# Patient Record
Sex: Female | Born: 1935 | Race: White | Hispanic: No | Marital: Single
Health system: Southern US, Community
[De-identification: ages and names within clinical notes are randomized; demographics above are authoritative.]

---

## 2008-05-06 ENCOUNTER — Emergency Department: Payer: Self-pay | Admitting: Internal Medicine

## 2008-09-19 ENCOUNTER — Ambulatory Visit: Payer: Self-pay | Admitting: Family Medicine

## 2009-05-25 ENCOUNTER — Inpatient Hospital Stay: Payer: Self-pay | Admitting: Specialist

## 2009-05-27 ENCOUNTER — Inpatient Hospital Stay: Payer: Self-pay | Admitting: Internal Medicine

## 2009-06-06 ENCOUNTER — Encounter: Payer: Self-pay | Admitting: Internal Medicine

## 2009-07-12 ENCOUNTER — Inpatient Hospital Stay: Payer: Self-pay | Admitting: Internal Medicine

## 2009-09-04 ENCOUNTER — Emergency Department: Payer: Self-pay | Admitting: Emergency Medicine

## 2009-09-09 ENCOUNTER — Emergency Department: Payer: Self-pay | Admitting: Emergency Medicine

## 2009-09-14 ENCOUNTER — Ambulatory Visit: Payer: Self-pay | Admitting: Family Medicine

## 2009-10-09 ENCOUNTER — Ambulatory Visit: Payer: Self-pay | Admitting: Family Medicine

## 2009-12-20 ENCOUNTER — Ambulatory Visit: Payer: Self-pay | Admitting: Specialist

## 2010-07-08 ENCOUNTER — Ambulatory Visit: Payer: Self-pay | Admitting: Specialist

## 2010-09-11 ENCOUNTER — Ambulatory Visit: Payer: Self-pay | Admitting: Urology

## 2011-01-14 ENCOUNTER — Ambulatory Visit: Payer: Self-pay | Admitting: Specialist

## 2011-07-17 ENCOUNTER — Ambulatory Visit: Payer: Self-pay | Admitting: Specialist

## 2011-07-17 LAB — CREATININE, SERUM: EGFR (Non-African Amer.): >60

## 2011-08-04 ENCOUNTER — Inpatient Hospital Stay: Payer: Self-pay | Admitting: Internal Medicine

## 2011-08-04 ENCOUNTER — Ambulatory Visit: Payer: Self-pay | Admitting: Internal Medicine

## 2011-08-04 LAB — PRO B NATRIURETIC PEPTIDE: B-Type Natriuretic Peptide: 312 pg/mL (ref 0–450)

## 2011-08-04 LAB — BASIC METABOLIC PANEL
BUN: 11 mg/dL (ref 7–18)
Chloride: 98 mmol/L (ref 98–107)
Creatinine: 0.59 mg/dL — ABNORMAL LOW (ref 0.60–1.30)
Osmolality: 280 (ref 275–301)
Potassium: 4.3 mmol/L (ref 3.5–5.1)
Sodium: 141 mmol/L (ref 136–145)

## 2011-08-04 LAB — CBC
HCT: 40 % (ref 35.0–47.0)
MCH: 29 pg (ref 26.0–34.0)
MCHC: 31.9 g/dL — ABNORMAL LOW (ref 32.0–36.0)
MCV: 91 fL (ref 80–100)
Platelet: 10 10*3/uL — CL (ref 150–440)
RBC: 4.4 10*6/uL (ref 3.80–5.20)
RDW: 13.5 % (ref 11.5–14.5)
WBC: 6.6 10*3/uL (ref 3.6–11.0)

## 2011-08-04 LAB — TROPONIN I: Troponin-I: <0.02 ng/mL

## 2011-08-05 LAB — BASIC METABOLIC PANEL
Anion Gap: 4 — ABNORMAL LOW (ref 7–16)
BUN: 15 mg/dL (ref 7–18)
Calcium, Total: 9 mg/dL (ref 8.5–10.1)
Chloride: 101 mmol/L (ref 98–107)
Co2: 38 mmol/L — ABNORMAL HIGH (ref 21–32)
EGFR (African American): >60
Osmolality: 286 (ref 275–301)

## 2011-08-05 LAB — CBC WITH DIFFERENTIAL/PLATELET
Bands: 3 %
Basophil %: 1.2 %
Eosinophil %: 1 %
HCT: 35.7 % (ref 35.0–47.0)
Lymphocyte %: 7.1 %
Lymphocytes: 8 %
MCHC: 32 g/dL (ref 32.0–36.0)
MCV: 90 fL (ref 80–100)
Monocyte #: 0.5 x10 3/mm (ref 0.2–0.9)
Monocyte %: 9.2 %
Monocytes: 7 %
Neutrophil %: 81.5 %
RDW: 13.3 % (ref 11.5–14.5)
Segmented Neutrophils: 81 %
Variant Lymphocyte - H1-Rlymph: 0 %

## 2011-08-05 LAB — FIBRIN DEGRADATION PROD.(ARMC ONLY): Fibrin Degradation Prod.: >10 — ABNORMAL HIGH (ref 2.1–7.7)

## 2011-08-05 LAB — IRON AND TIBC
Iron Bind.Cap.(Total): 341 ug/dL (ref 250–450)
Iron Saturation: 13 %

## 2011-08-05 LAB — PROTIME-INR: INR: 0.9

## 2011-08-05 LAB — RETICULOCYTES: Reticulocyte: 1.92 % — ABNORMAL HIGH (ref 0.5–1.5)

## 2011-08-05 LAB — FOLATE: Folic Acid: 18.9 ng/mL (ref 3.1–100.0)

## 2011-08-06 LAB — PROT IMMUNOELECTROPHORES(ARMC)

## 2011-08-06 LAB — CBC WITH DIFFERENTIAL/PLATELET
Basophil #: 0 10*3/uL (ref 0.0–0.1)
Eosinophil %: 0 %
HCT: 37.9 % (ref 35.0–47.0)
HGB: 12.1 g/dL (ref 12.0–16.0)
Lymphocyte #: 0.2 10*3/uL — ABNORMAL LOW (ref 1.0–3.6)
Lymphocyte %: 4.4 %
MCV: 90 fL (ref 80–100)
Monocyte %: 1.3 %
Neutrophil #: 3.3 10*3/uL (ref 1.4–6.5)
Platelet: 33 10*3/uL — ABNORMAL LOW (ref 150–440)
RBC: 4.21 10*6/uL (ref 3.80–5.20)
RDW: 13.1 % (ref 11.5–14.5)
WBC: 3.6 10*3/uL (ref 3.6–11.0)

## 2011-08-06 LAB — CA 125: CA 125: 18.1 U/mL (ref 0.0–34.0)

## 2011-08-08 ENCOUNTER — Ambulatory Visit: Payer: Self-pay | Admitting: Internal Medicine

## 2011-08-08 LAB — CBC CANCER CENTER
Basophil #: 0 x10 3/mm (ref 0.0–0.1)
HCT: 42.3 % (ref 35.0–47.0)
HGB: 13.1 g/dL (ref 12.0–16.0)
Lymphocyte #: 0.1 x10 3/mm — ABNORMAL LOW (ref 1.0–3.6)
Lymphocyte %: 1.1 %
MCH: 28.5 pg (ref 26.0–34.0)
MCHC: 31 g/dL — ABNORMAL LOW (ref 32.0–36.0)
MCV: 92 fL (ref 80–100)
Monocyte %: 0.7 %
RDW: 13.7 % (ref 11.5–14.5)

## 2011-08-08 LAB — GLUCOSE, RANDOM: Glucose: 172 mg/dL — ABNORMAL HIGH (ref 65–99)

## 2011-08-14 LAB — CBC CANCER CENTER
Basophil #: 0 x10 3/mm (ref 0.0–0.1)
Eosinophil %: 0 %
HCT: 43.4 % (ref 35.0–47.0)
Lymphocyte %: 5.8 %
MCH: 29.6 pg (ref 26.0–34.0)
MCHC: 32.4 g/dL (ref 32.0–36.0)
MCV: 91 fL (ref 80–100)
Monocyte #: 0.6 x10 3/mm (ref 0.2–0.9)
Monocyte %: 8.4 %
Neutrophil %: 85.6 %
Platelet: 137 x10 3/mm — ABNORMAL LOW (ref 150–440)
RBC: 4.76 10*6/uL (ref 3.80–5.20)
RDW: 13.6 % (ref 11.5–14.5)
WBC: 7.7 x10 3/mm (ref 3.6–11.0)

## 2011-08-14 LAB — GLUCOSE, RANDOM: Glucose: 95 mg/dL

## 2011-08-21 LAB — CBC CANCER CENTER
Basophil #: 0 x10 3/mm (ref 0.0–0.1)
Eosinophil #: 0 x10 3/mm (ref 0.0–0.7)
Eosinophil %: 0.1 %
Lymphocyte %: 8.4 %
MCH: 29.4 pg (ref 26.0–34.0)
MCHC: 32 g/dL (ref 32.0–36.0)
MCV: 92 fL (ref 80–100)
Monocyte #: 0.7 x10 3/mm (ref 0.2–0.9)
Neutrophil %: 81.7 %
Platelet: 117 x10 3/mm — ABNORMAL LOW (ref 150–440)
RBC: 4.81 10*6/uL (ref 3.80–5.20)

## 2011-08-21 LAB — GLUCOSE, RANDOM: Glucose: 77 mg/dL (ref 65–99)

## 2011-08-28 LAB — CBC CANCER CENTER
Basophil #: 0 x10 3/mm (ref 0.0–0.1)
Eosinophil %: 0.1 %
Lymphocyte #: 0.6 x10 3/mm — ABNORMAL LOW (ref 1.0–3.6)
Lymphocyte %: 9.2 %
MCHC: 32.1 g/dL (ref 32.0–36.0)
MCV: 92 fL (ref 80–100)
Monocyte %: 8.4 %
Neutrophil %: 82 %
Platelet: 59 x10 3/mm — ABNORMAL LOW (ref 150–440)
RBC: 4.68 10*6/uL (ref 3.80–5.20)
RDW: 13.6 % (ref 11.5–14.5)

## 2011-09-04 ENCOUNTER — Ambulatory Visit: Payer: Self-pay | Admitting: Internal Medicine

## 2011-09-04 LAB — CBC CANCER CENTER
Basophil #: 0 x10 3/mm (ref 0.0–0.1)
Basophil %: 0.2 %
Eosinophil #: 0 x10 3/mm (ref 0.0–0.7)
Eosinophil %: 0 %
HGB: 13.8 g/dL (ref 12.0–16.0)
Lymphocyte #: 0.6 x10 3/mm — ABNORMAL LOW (ref 1.0–3.6)
MCH: 29.5 pg (ref 26.0–34.0)
MCHC: 32.1 g/dL (ref 32.0–36.0)
MCV: 92 fL (ref 80–100)
Monocyte #: 0.6 x10 3/mm (ref 0.2–0.9)
Monocyte %: 8.1 %
Neutrophil %: 83.2 %
Platelet: 76 x10 3/mm — ABNORMAL LOW (ref 150–440)
RBC: 4.69 10*6/uL (ref 3.80–5.20)

## 2011-09-04 LAB — HEPATIC FUNCTION PANEL A (ARMC)
Albumin: 3.3 g/dL — ABNORMAL LOW (ref 3.4–5.0)
Alkaline Phosphatase: 72 U/L (ref 50–136)
Bilirubin, Direct: 0.1 mg/dL (ref 0.00–0.20)
Bilirubin,Total: 0.3 mg/dL (ref 0.2–1.0)
SGOT(AST): 11 U/L — ABNORMAL LOW (ref 15–37)

## 2011-09-04 LAB — BASIC METABOLIC PANEL
Calcium, Total: 8.8 mg/dL (ref 8.5–10.1)
Co2: 44 mmol/L (ref 21–32)
EGFR (African American): >60
EGFR (Non-African Amer.): 55 — ABNORMAL LOW
Glucose: 66 mg/dL (ref 65–99)
Osmolality: 290 (ref 275–301)

## 2011-09-11 LAB — CBC CANCER CENTER
Eosinophil %: 0 %
HGB: 13.4 g/dL (ref 12.0–16.0)
Lymphocyte #: 0.5 x10 3/mm — ABNORMAL LOW (ref 1.0–3.6)
Lymphocyte %: 5.8 %
MCH: 29.5 pg (ref 26.0–34.0)
Monocyte #: 0.6 x10 3/mm (ref 0.2–0.9)
Neutrophil %: 87.5 %
Platelet: 265 x10 3/mm (ref 150–440)
RBC: 4.56 10*6/uL (ref 3.80–5.20)
RDW: 14 % (ref 11.5–14.5)
WBC: 8.9 x10 3/mm (ref 3.6–11.0)

## 2011-09-18 ENCOUNTER — Emergency Department: Payer: Self-pay | Admitting: *Deleted

## 2011-09-18 LAB — CBC CANCER CENTER
Basophil #: 0 x10 3/mm (ref 0.0–0.1)
Eosinophil #: 0 x10 3/mm (ref 0.0–0.7)
Eosinophil %: 0 %
Lymphocyte #: 0.4 x10 3/mm — ABNORMAL LOW (ref 1.0–3.6)
Lymphocyte %: 2.5 %
MCH: 29.8 pg (ref 26.0–34.0)
MCHC: 31.9 g/dL — ABNORMAL LOW (ref 32.0–36.0)
MCV: 94 fL (ref 80–100)
Monocyte #: 1 x10 3/mm — ABNORMAL HIGH (ref 0.2–0.9)
Monocyte %: 6.9 %
Neutrophil %: 90.3 %
Platelet: 259 x10 3/mm (ref 150–440)
RDW: 14.4 % (ref 11.5–14.5)
WBC: 14.2 x10 3/mm — ABNORMAL HIGH (ref 3.6–11.0)

## 2011-09-18 LAB — CBC
HGB: 12.9 g/dL (ref 12.0–16.0)
MCHC: 33 g/dL (ref 32.0–36.0)
Platelet: 240 10*3/uL (ref 150–440)
RDW: 14.4 % (ref 11.5–14.5)

## 2011-09-18 LAB — COMPREHENSIVE METABOLIC PANEL
Albumin: 3.1 g/dL — ABNORMAL LOW (ref 3.4–5.0)
Anion Gap: 5 — ABNORMAL LOW (ref 7–16)
BUN: 30 mg/dL — ABNORMAL HIGH (ref 7–18)
Calcium, Total: 8.7 mg/dL (ref 8.5–10.1)
Chloride: 99 mmol/L (ref 98–107)
Co2: 38 mmol/L — ABNORMAL HIGH (ref 21–32)
Creatinine: 0.89 mg/dL (ref 0.60–1.30)
EGFR (African American): >60
Glucose: 95 mg/dL (ref 65–99)
SGOT(AST): 16 U/L (ref 15–37)
SGPT (ALT): 17 U/L (ref 12–78)

## 2011-09-18 LAB — GLUCOSE, RANDOM: Glucose: 111 mg/dL — ABNORMAL HIGH (ref 65–99)

## 2011-09-19 ENCOUNTER — Emergency Department: Payer: Self-pay | Admitting: *Deleted

## 2011-09-19 LAB — COMPREHENSIVE METABOLIC PANEL
Albumin: 3.2 g/dL — ABNORMAL LOW (ref 3.4–5.0)
Alkaline Phosphatase: 59 U/L (ref 50–136)
BUN: 26 mg/dL — ABNORMAL HIGH (ref 7–18)
Bilirubin,Total: 0.5 mg/dL (ref 0.2–1.0)
Chloride: 100 mmol/L (ref 98–107)
Creatinine: 1.11 mg/dL (ref 0.60–1.30)
EGFR (African American): 56 — ABNORMAL LOW
EGFR (Non-African Amer.): 48 — ABNORMAL LOW
Glucose: 106 mg/dL — ABNORMAL HIGH (ref 65–99)
SGOT(AST): 19 U/L (ref 15–37)
SGPT (ALT): 17 U/L (ref 12–78)
Sodium: 141 mmol/L (ref 136–145)

## 2011-09-19 LAB — ETHANOL
Ethanol %: <0.003 % (ref 0.000–0.080)
Ethanol: <3 mg/dL

## 2011-09-19 LAB — DRUG SCREEN, URINE
Barbiturates, Ur Screen: NEGATIVE (ref ?–200)
Benzodiazepine, Ur Scrn: POSITIVE (ref ?–200)
Cannabinoid 50 Ng, Ur ~~LOC~~: NEGATIVE (ref ?–50)
MDMA (Ecstasy)Ur Screen: NEGATIVE (ref ?–500)
Methadone, Ur Screen: NEGATIVE (ref ?–300)
Phencyclidine (PCP) Ur S: NEGATIVE (ref ?–25)

## 2011-09-19 LAB — CBC
MCV: 92 fL (ref 80–100)
RDW: 14.3 % (ref 11.5–14.5)
WBC: 9.6 10*3/uL (ref 3.6–11.0)

## 2011-09-19 LAB — ACETAMINOPHEN LEVEL: Acetaminophen: <2 ug/mL

## 2011-09-22 ENCOUNTER — Inpatient Hospital Stay: Payer: Self-pay | Admitting: Internal Medicine

## 2011-09-22 LAB — URINALYSIS, COMPLETE
Bilirubin,UR: NEGATIVE
Glucose,UR: NEGATIVE mg/dL (ref 0–75)
Ketone: NEGATIVE
Ph: 6 (ref 4.5–8.0)
Specific Gravity: 1.03 (ref 1.003–1.030)
Transitional Epi: <1

## 2011-09-22 LAB — COMPREHENSIVE METABOLIC PANEL
Albumin: 3.3 g/dL — ABNORMAL LOW (ref 3.4–5.0)
Alkaline Phosphatase: 49 U/L — ABNORMAL LOW (ref 50–136)
Anion Gap: 7 (ref 7–16)
BUN: 39 mg/dL — ABNORMAL HIGH (ref 7–18)
Calcium, Total: 9 mg/dL (ref 8.5–10.1)
Creatinine: 1.09 mg/dL (ref 0.60–1.30)
EGFR (Non-African Amer.): 49 — ABNORMAL LOW
Glucose: 97 mg/dL (ref 65–99)
Osmolality: 292 (ref 275–301)
Potassium: 4.3 mmol/L (ref 3.5–5.1)
Sodium: 142 mmol/L (ref 136–145)
Total Protein: 6.3 g/dL — ABNORMAL LOW (ref 6.4–8.2)

## 2011-09-22 LAB — CBC WITH DIFFERENTIAL/PLATELET
Basophil %: 0.2 %
Eosinophil #: 0 10*3/uL (ref 0.0–0.7)
Eosinophil %: 0 %
HCT: 38.1 % (ref 35.0–47.0)
HGB: 12.4 g/dL (ref 12.0–16.0)
Lymphocyte %: 7 %
MCH: 29.7 pg (ref 26.0–34.0)
MCHC: 32.6 g/dL (ref 32.0–36.0)
Monocyte #: 0.8 x10 3/mm (ref 0.2–0.9)
Monocyte %: 8.3 %
Neutrophil #: 8.3 10*3/uL — ABNORMAL HIGH (ref 1.4–6.5)
Neutrophil %: 84.5 %
Platelet: 219 10*3/uL (ref 150–440)
RBC: 4.19 10*6/uL (ref 3.80–5.20)
WBC: 9.8 10*3/uL (ref 3.6–11.0)

## 2011-09-22 LAB — PRO B NATRIURETIC PEPTIDE: B-Type Natriuretic Peptide: 431 pg/mL (ref 0–450)

## 2011-09-22 LAB — THEOPHYLLINE LEVEL: Theophylline: 11.9 ug/mL (ref 10.0–20.0)

## 2011-09-23 LAB — CBC WITH DIFFERENTIAL/PLATELET
Basophil #: 0 10*3/uL (ref 0.0–0.1)
Eosinophil #: 0 10*3/uL (ref 0.0–0.7)
HCT: 34.1 % — ABNORMAL LOW (ref 35.0–47.0)
Lymphocyte #: 0.1 10*3/uL — ABNORMAL LOW (ref 1.0–3.6)
Lymphocyte %: 0.9 %
MCH: 31.3 pg (ref 26.0–34.0)
MCHC: 34.2 g/dL (ref 32.0–36.0)
MCV: 91 fL (ref 80–100)
Monocyte #: 0.1 x10 3/mm — ABNORMAL LOW (ref 0.2–0.9)
Neutrophil #: 9.2 10*3/uL — ABNORMAL HIGH (ref 1.4–6.5)
Platelet: 195 10*3/uL (ref 150–440)
RDW: 14.7 % — ABNORMAL HIGH (ref 11.5–14.5)

## 2011-09-23 LAB — BASIC METABOLIC PANEL
Anion Gap: 4 — ABNORMAL LOW (ref 7–16)
BUN: 27 mg/dL — ABNORMAL HIGH (ref 7–18)
EGFR (African American): >60
EGFR (Non-African Amer.): 55 — ABNORMAL LOW
Glucose: 155 mg/dL — ABNORMAL HIGH (ref 65–99)
Potassium: 4.5 mmol/L (ref 3.5–5.1)
Sodium: 145 mmol/L (ref 136–145)

## 2011-09-25 LAB — URINE CULTURE

## 2011-09-27 LAB — CULTURE, BLOOD (SINGLE)

## 2011-10-05 ENCOUNTER — Ambulatory Visit: Payer: Self-pay | Admitting: Internal Medicine

## 2011-12-05 DEATH — deceased

## 2013-06-30 IMAGING — US ABDOMEN ULTRASOUND LIMITED
1 series · 14 of 25 positions shown · non-contrast
Comparison: none

REASON FOR EXAM: Thrombocytopenia, eval for hepatosplenomegaly
COMMENTS:   Body Site: Liver; Spleen

[Series 1: abdomen ultrasound limited · 0.28mm/px · 14 of 31 slices shown]
[im 1/31]
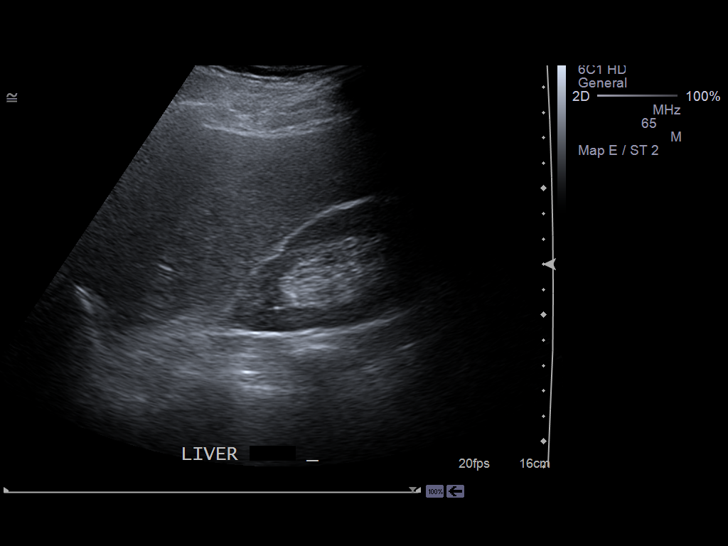
[im 3/31]
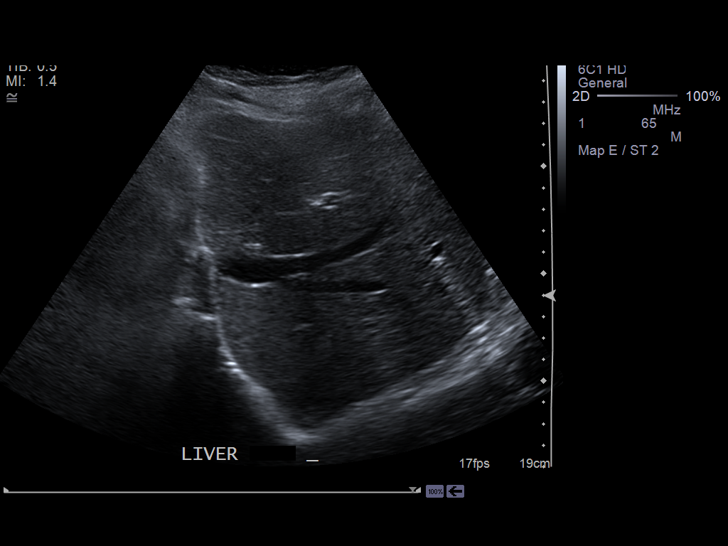
[im 6/31]
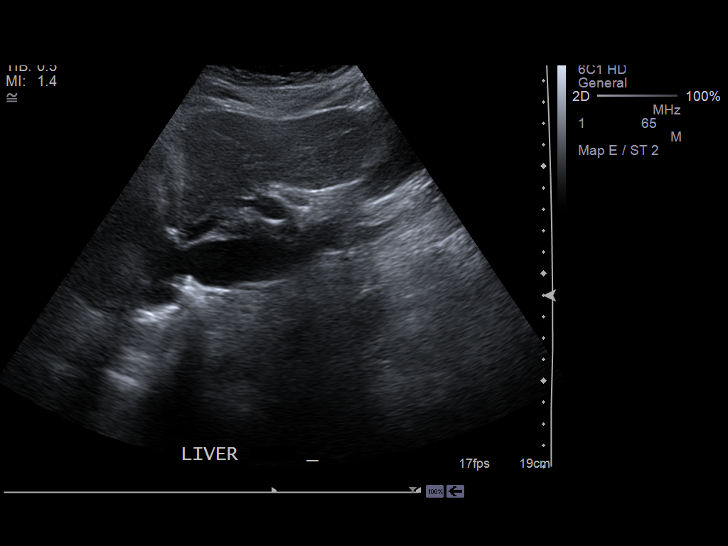
[im 8/31]
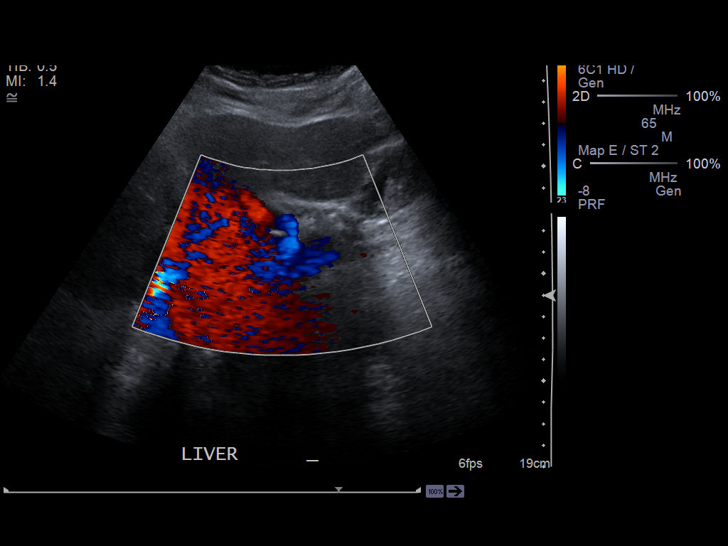
[im 11/31]
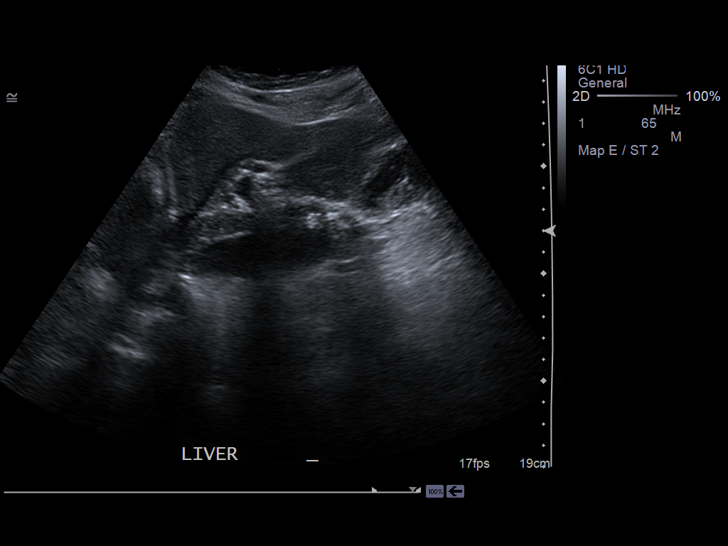
[im 12/31]
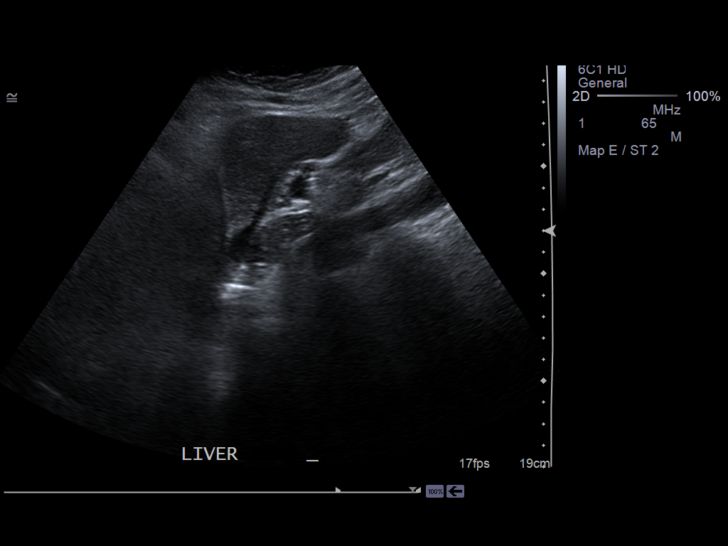
[im 14/31]
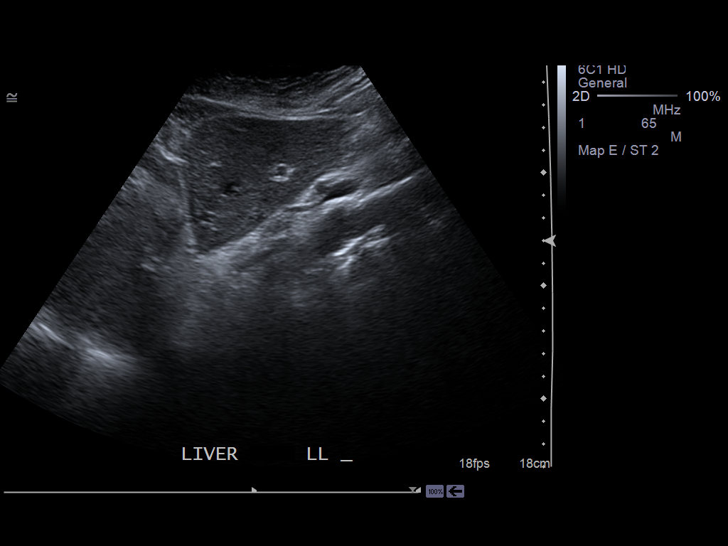
[im 17/31]
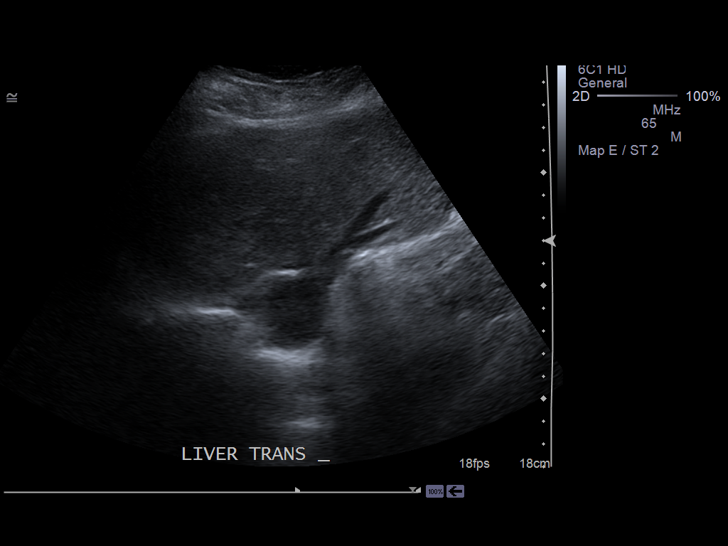
[im 19/31]
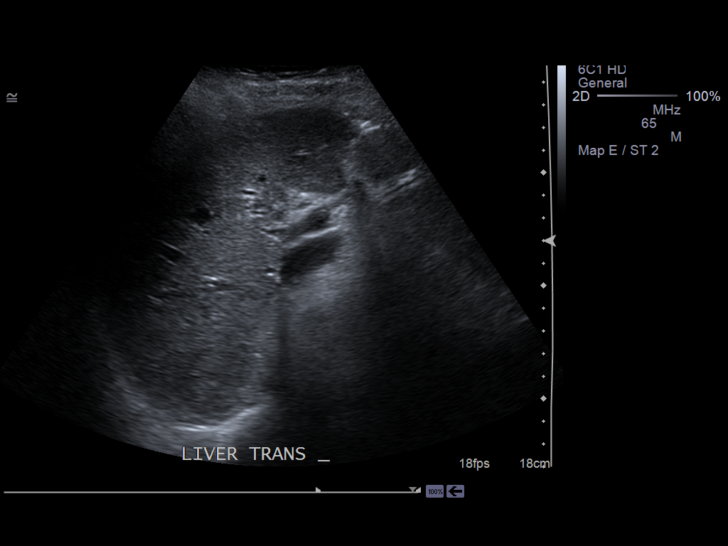
[im 21/31]
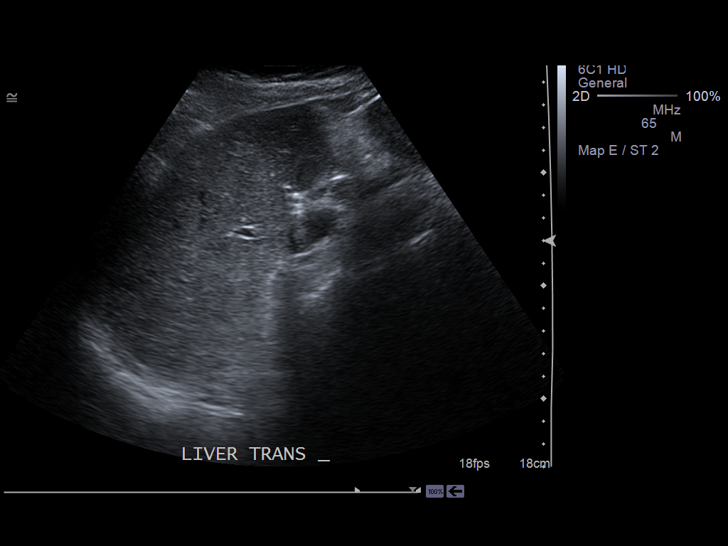
[im 23/31]
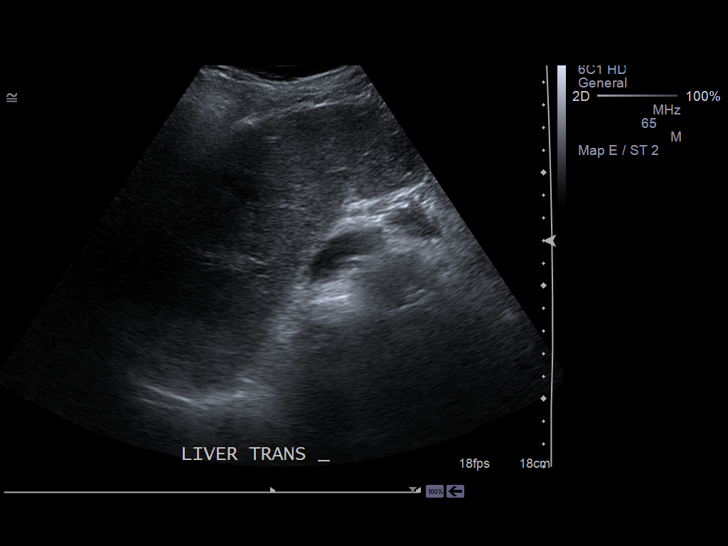
[im 26/31]
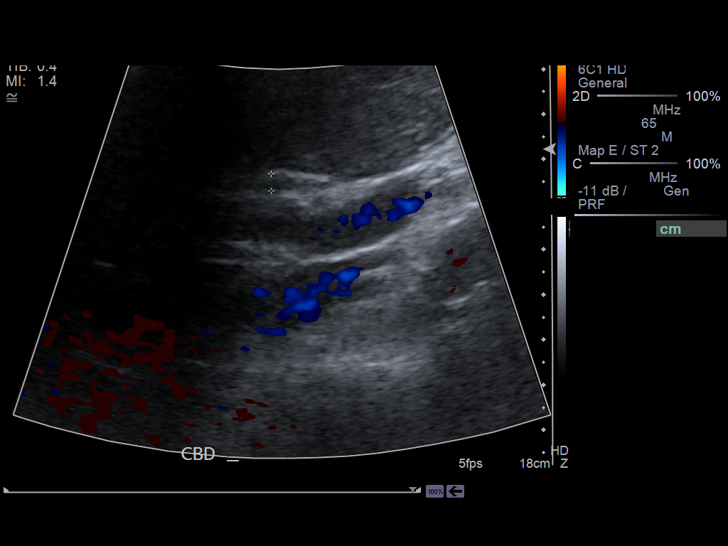
[im 28/31]
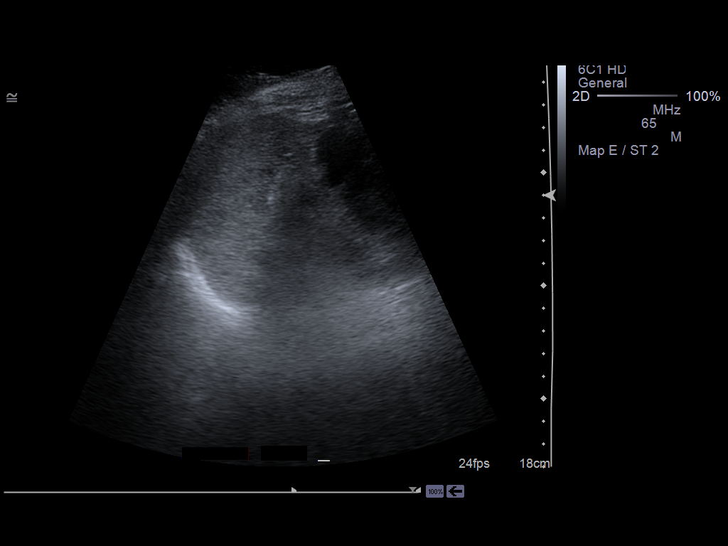
[im 31/31]
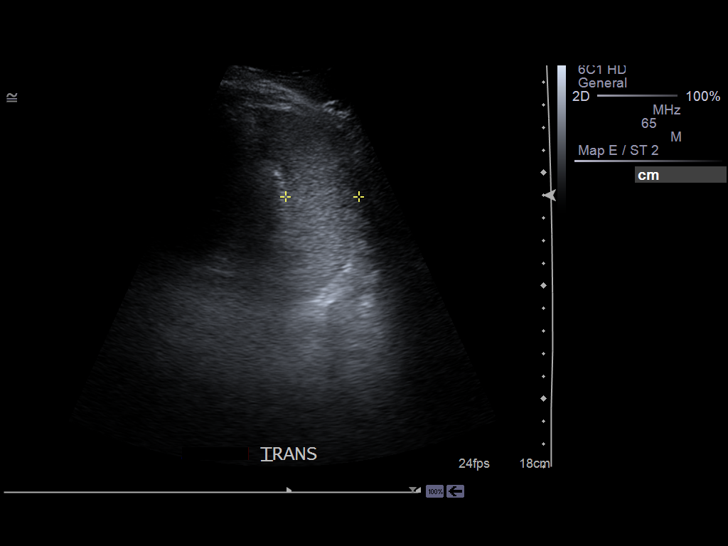

[14 of 25 positions shown; findings below may reference images not displayed]

PROCEDURE:     US  - US ABDOMEN LIMITED SURVEY  - August 06, 2011  [DATE]

RESULT:     The patient underwent limited right upper quadrant ultrasound.

The gallbladder is surgically absent. The liver exhibits normal echotexture
with no focal mass or ductal dilation. Portal venous flow is normal in
direction toward the liver. The common bile duct is normal at 3.8 mm in
diameter. The spleen is normal in size.
IMPRESSION: This limited right upper quadrant ultrasound reveals no
abnormality of the liver or spleen. Specifically there is no
hepatosplenomegaly.

## 2013-08-16 IMAGING — CT CT CHEST W/ CM
1 series · 15 of 33 positions shown, 19 images · IV contrast (APPLIED)
Comparison: None

REASON FOR EXAM: hypotensive, tachycardia, sob
COMMENTS:

PROCEDURE:     CT  - CT CHEST (FOR PE) W  - September 22, 2011  [DATE]
RESULT:     Indications: Chest Pain
TECHNIQUE: A thin-section spiral CT from the lung apices to the upper
abdomen was acquired on a multi slice scanner following 100ml [id number]-[id number]
intravenous contrast. These images were then transferred to the Siemens work
station and were subsequently reviewed utilizing 3-D reconstructions and MIP
images.

[Series 6: soft tissue · axial · 0.91mm/px · z∈[-283,+8]mm · 15 of 115 slices shown, 19 images]
[im 9/115  mediastinal]
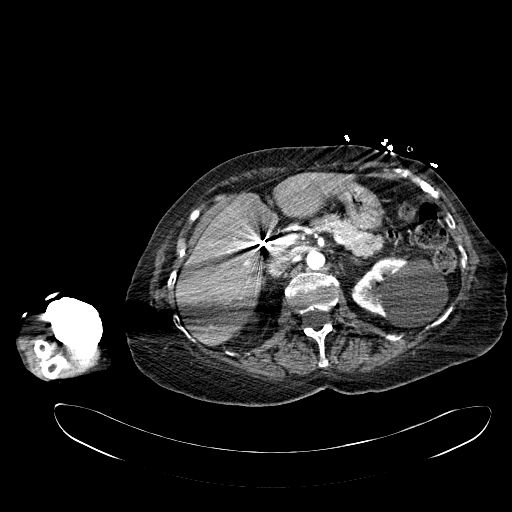
[im 9/115  lung]
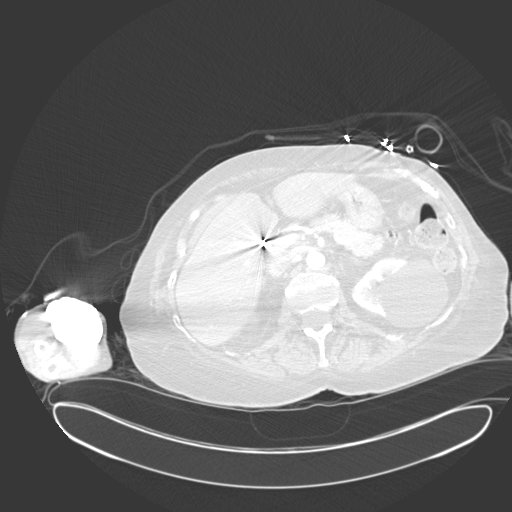
[im 17/115  lung]
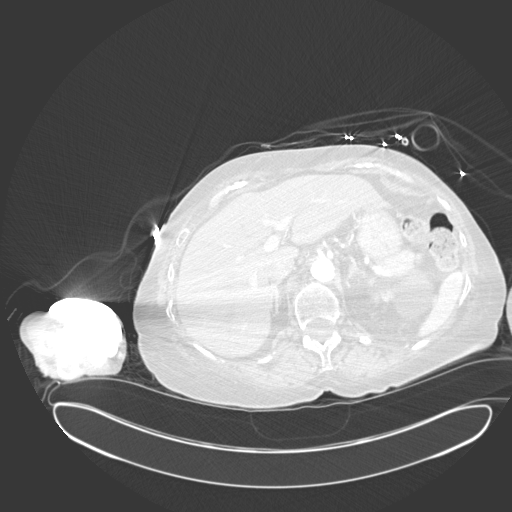
[im 23/115  lung]
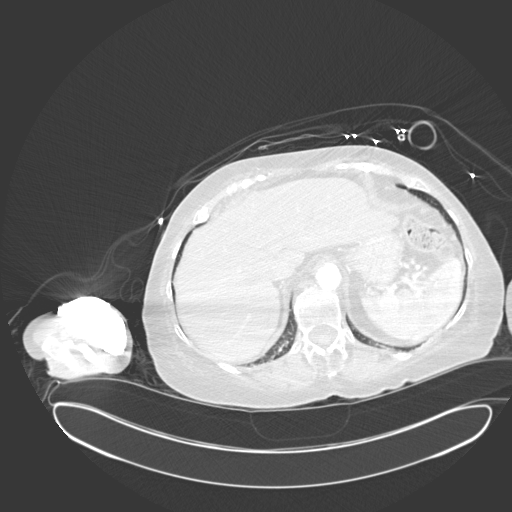
[im 30/115  lung]
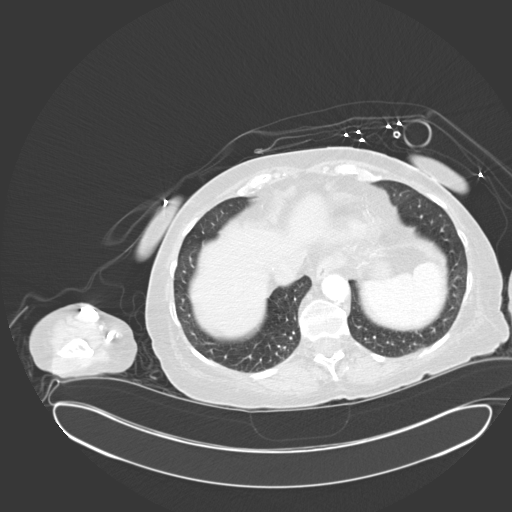
[im 39/115  mediastinal]
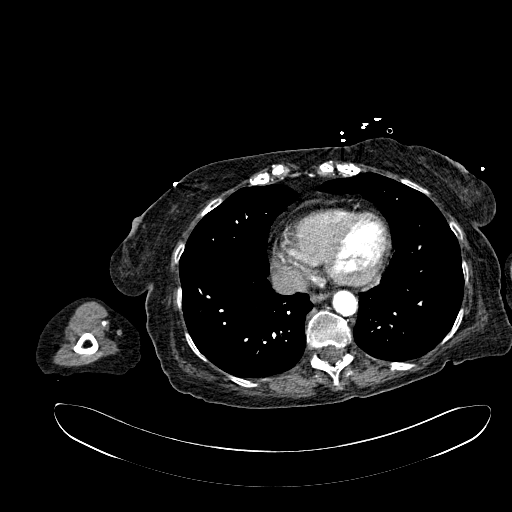
[im 39/115  lung]
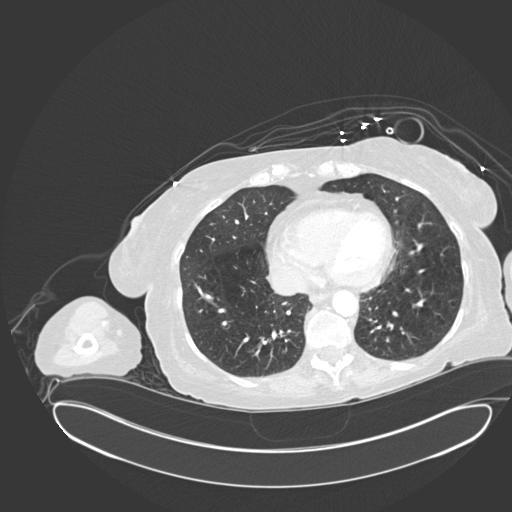
[im 46/115  lung]
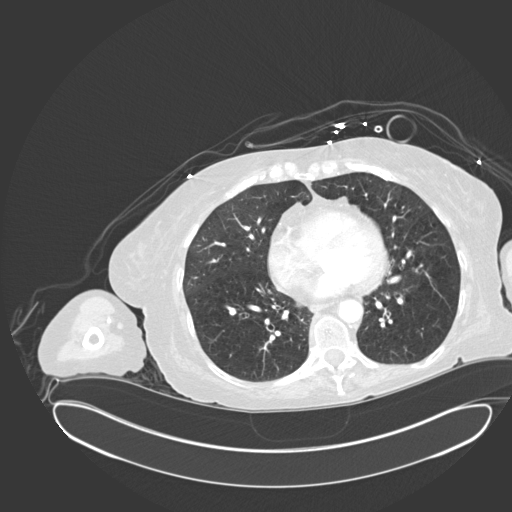
[im 51/115  lung]
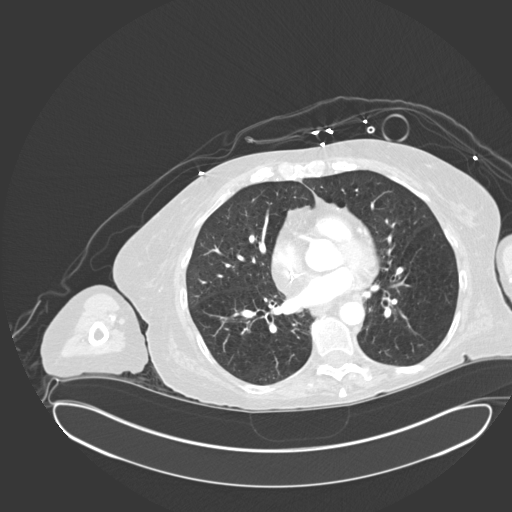
[im 60/115  lung]
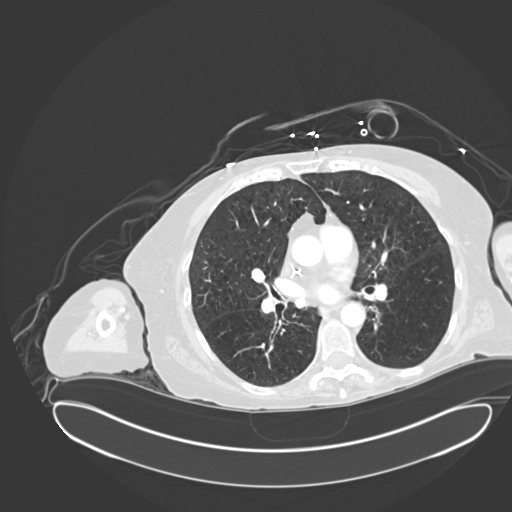
[im 64/115  mediastinal]
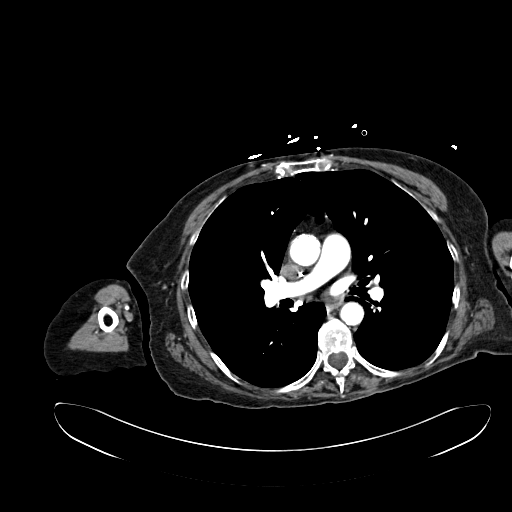
[im 64/115  lung]
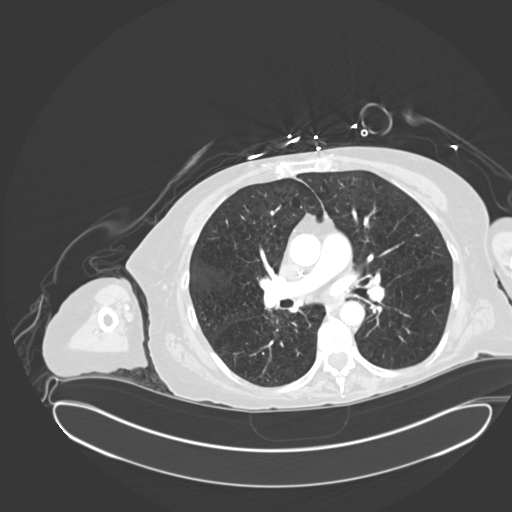
[im 69/115  lung]
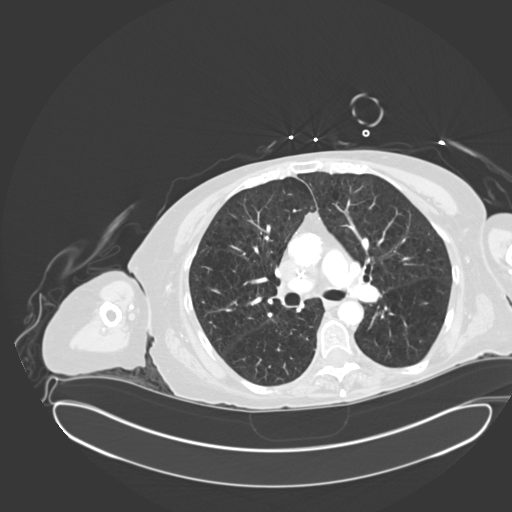
[im 77/115  lung]
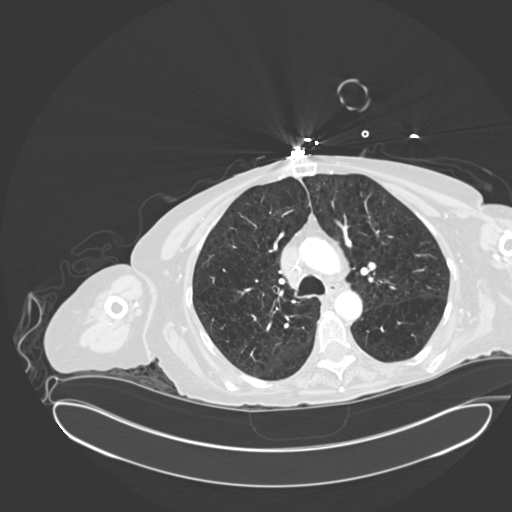
[im 85/115  lung]
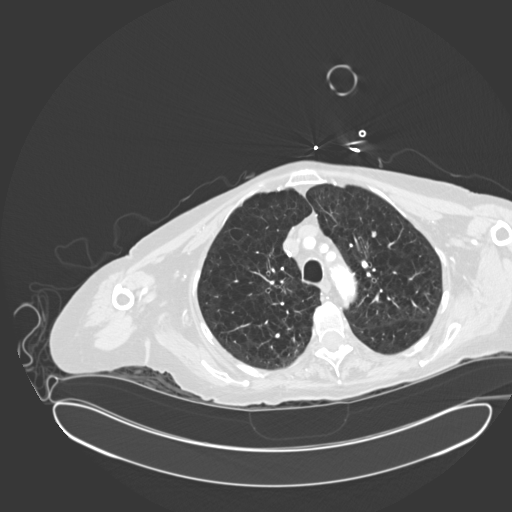
[im 92/115  mediastinal]
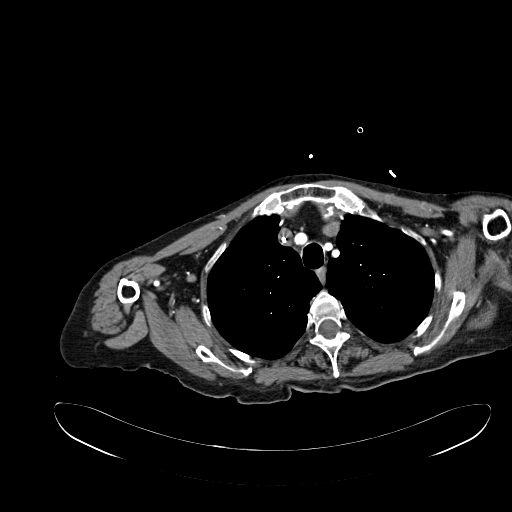
[im 92/115  lung]
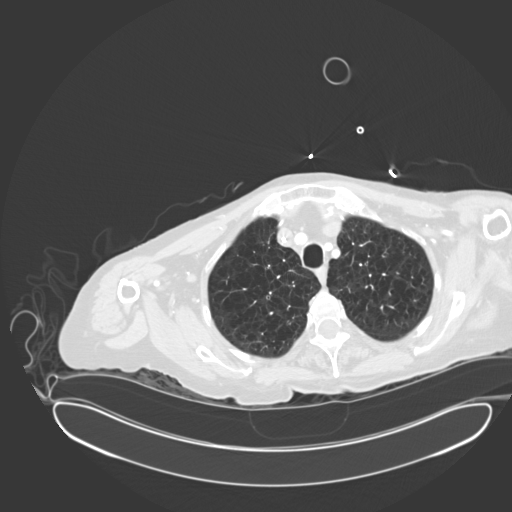
[im 98/115  lung]
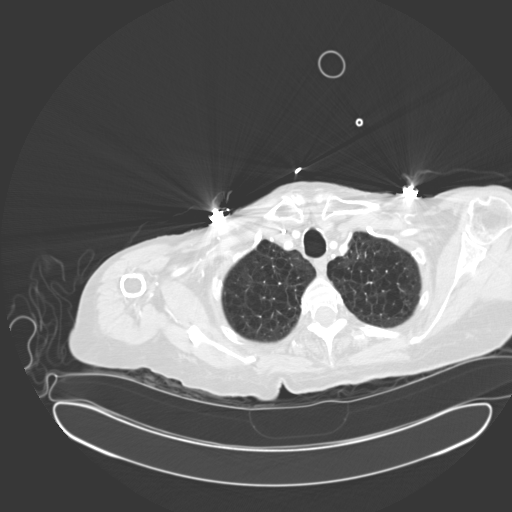
[im 106/115  lung]
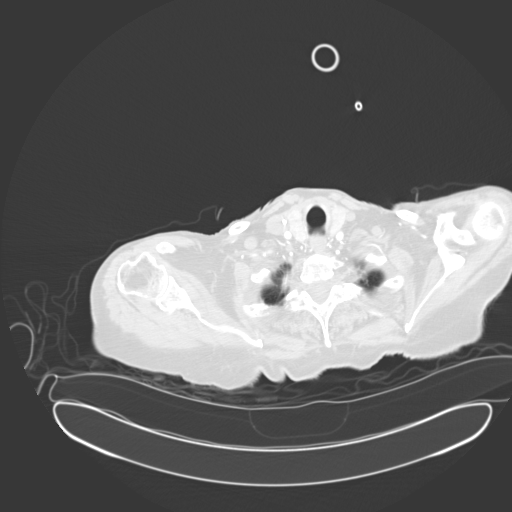

[15 of 33 positions shown; findings below may reference images not displayed]

FINDINGS: There is adequate opacification of the pulmonary arteries. There is no
pulmonary embolus. The main pulmonary artery, right main pulmonary artery,
and left main pulmonary arteries are normal in size. The heart size is
normal. There is no pericardial effusion.

There is severe bilateral emphysematous changes. There is no focal
consolidation, pleural effusion, or pneumothorax.

There is no axillary, hilar, or mediastinal adenopathy.

The osseous structures are unremarkable.

The visualized portions of the upper abdomen are unremarkable.
IMPRESSION: 1. No CT evidence of pulmonary embolus.

2. Severe bilateral emphysematous changes.

[REDACTED]

## 2013-08-16 IMAGING — CR DG CHEST 1V PORT
1 series · 1 of 1 positions shown · non-contrast
Comparison: none

REASON FOR EXAM: shortness of breath
COMMENTS:

PROCEDURE:     DXR - DXR PORTABLE CHEST SINGLE VIEW  - September 22, 2011  [DATE]
RESULT:     Comparison: None

[ap]
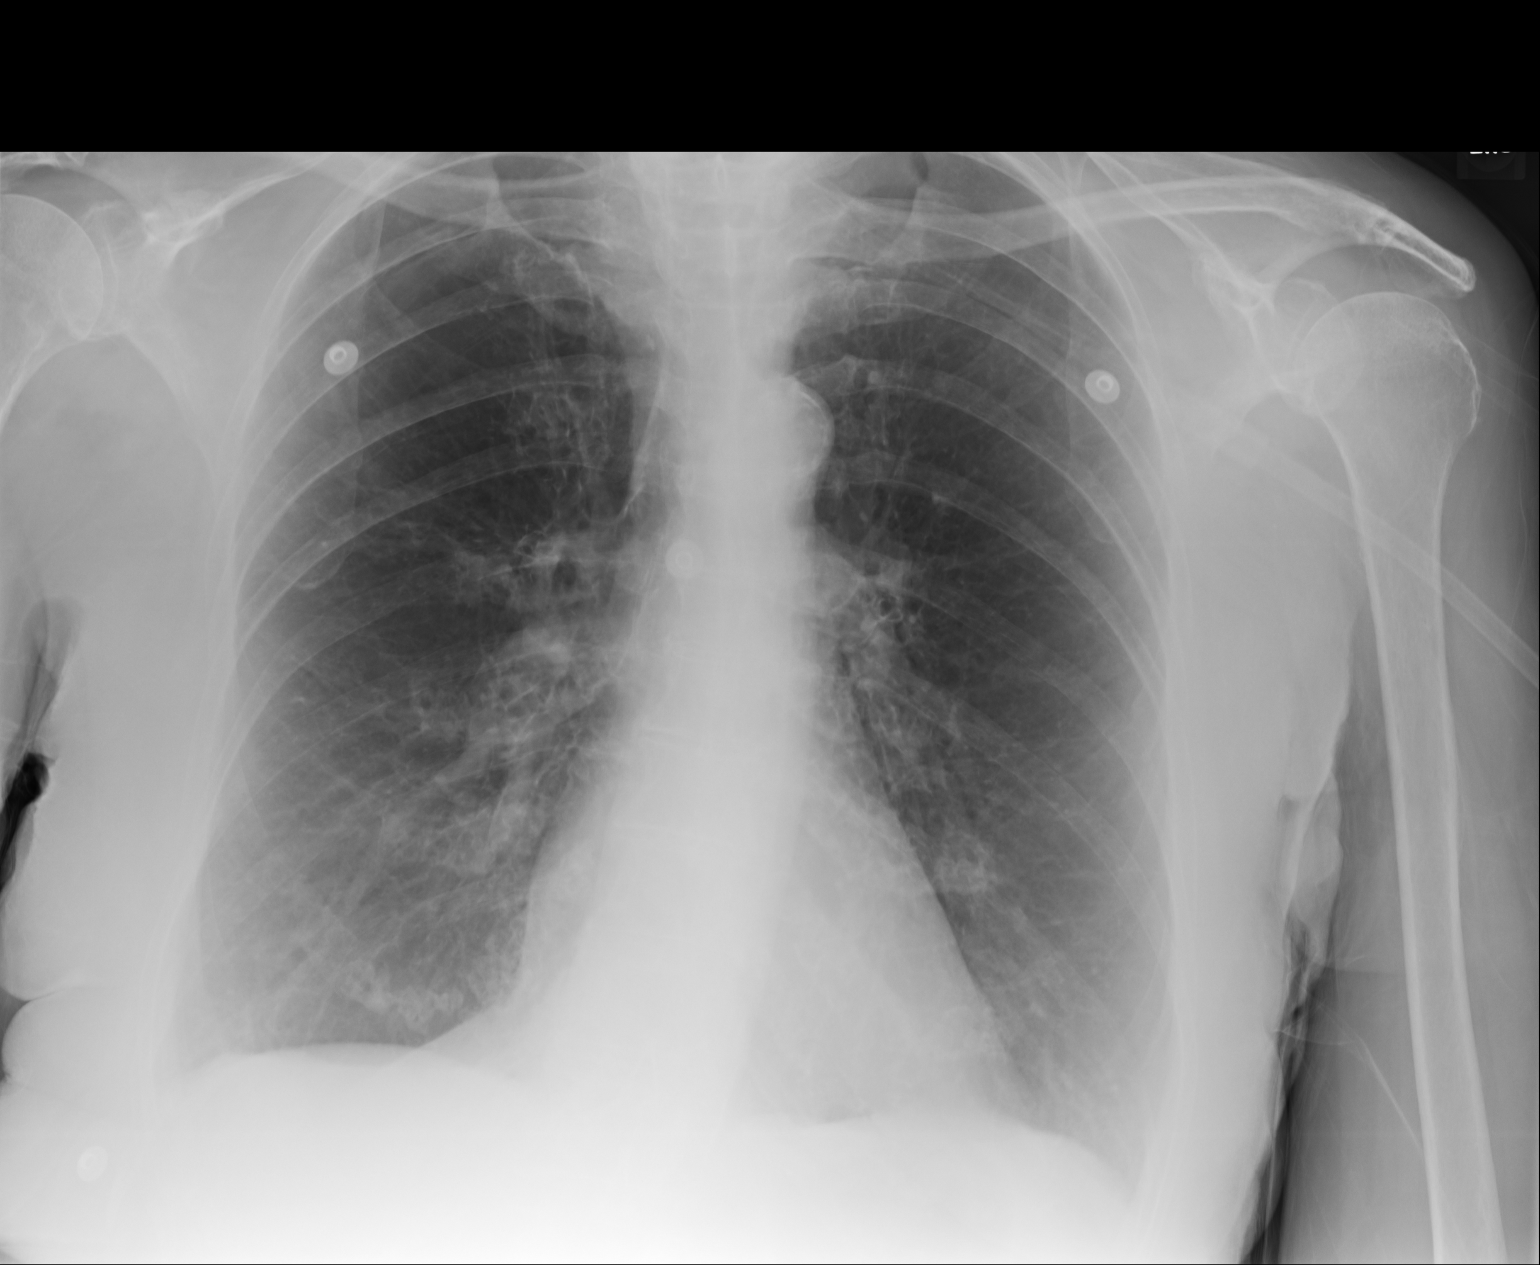

[1 of 1 positions shown; findings below may reference images not displayed]

FINDINGS: Single portable AP chest radiograph is provided. The lungs are hyperinflated
likely secondary to COPD. There is no focal parenchymal opacity, pleural
effusion, or pneumothorax. Normal cardiomediastinal silhouette. The osseous
structures are unremarkable.
IMPRESSION: No acute disease of the che[REDACTED]

## 2014-05-23 NOTE — Consult Note (Signed)
PATIENT NAME:  Donna [name], [place] A MR#:  [id number] DATE OF BIRTH:  1933/12/24  DATE OF CONSULTATION:  09/22/2011  REFERRING PHYSICIAN:  Auburn BilberryShreyang Patel, MD CONSULTING PHYSICIAN:  Ethelean Colla B. Bria Portales, MD  REASON FOR CONSULTATION: To assess the capacity to make medical decisions.   IDENTIFYING DATA: Donna [name] is a 81 year old female with no past psychiatric history.   CHIEF COMPLAINT: "I am of a sound mind."   HISTORY OF PRESENT ILLNESS: Donna [name] suffers stage IV chronic obstructive pulmonary disease. She is on chronic oxygen and became short of breath and her blood pressure was rather low, so the hospice nurse recommended that the patient be brought to the Emergency Room. She was admitted to Critical Care Unit. The patient denies any history of mental illness, no history of depression or psychosis. She does admit to chronic anxiety that is brought about by her chronic obstructive pulmonary disease. She is taking Xanax 0.25 mg twice daily for that. Reportedly, on the day prior to admission the patient was arguing with her daughters who came to visit from [place] and wants the patient relocate to [place] area. This is opposed by the granddaughter who currently lives with the patient and takes care of her. The granddaughter believes that the patient may not have the capacity to make medical decisions or decisions about her living arrangements. Reportedly, yesterday the patient made some comments suggestive of depressed mood. I did not hear anybody say that the patient was suicidal or homicide. The patient admits that she was very upset with her daughter and was arguing with her and things could have been said. In spite of that, given a choice, the patient would like to relocate to [place] to be with her daughter. She understands that there is a lot of pressure and a lot of conflict within the family. She has been living with her granddaughter and her husband who stopped working as a Chart[employer] loss adjusterschoolteacher to provide  care for the patient. All three of them own a part of the house. The granddaughter, apparently, adamantly opposes the patient's relocation. I did talk to the patient, her son and her daughter in the Critical Care Unit. They seem to be nice and supportive.   PAST PSYCHIATRIC HISTORY: None reported. She has never been treated for mental illness. No hospitalizations. No suicide attempts.   FAMILY PSYCHIATRIC HISTORY: None reported.   PAST MEDICAL HISTORY:  1. End-stage chronic obstructive pulmonary disease, on 2 liters of oxygen.  2. Pulmonary hypertension secondary to chronic obstructive pulmonary disease. 3. Idiopathic thrombocytopenia purpura.  4. Hypertension.  5. Dyslipidemia.  6. Cardiomyopathy with normal LVF.   ALLERGIES: No known drug allergies.   MEDICATIONS ON ADMISSION:  1. Advair 250/50 twice daily.  2. Albuterol every six hours as needed. 3. Alendronate 70 mg weekly. 4. Alprazolam 0.25 mg twice daily.  5. Diltiazem 120 mg daily.  6. Iron sulfate 325 mg daily.  7. Gabapentin 300 mg 3 times daily.  8. Hydrochlorothiazide 25 mg daily.  9. Lisinopril 10 mg daily.  10. Prednisone 20 mg daily on a taper. 11. Simvastatin 10 mg at bedtime.  12. Spiriva 18 mcg daily.  13. Theophylline 200 mg twice daily.  14. Ventolin 2 puffs every four hours as needed.   MEDICATIONS AT THE TIME OF CONSULTATION:  1. Tylenol as needed. 2. Xanax 0.25 twice daily.  3. Lovenox injections.  4. Ferrous Sulfate 325 mg daily.  5. Advair Diskus 250/50 twice daily.  6. Neurontin 300 mg 3  times daily.  7. Sliding scale insulin. 8. Solu-Medrol 60 mg every six hours. 9. Morphine injection as needed.  10. Zofran injection as needed. 11. Pantoprazole 40 mg in the morning. 12. Zocor 10 mg at bedtime. 13. Theophylline 200 mg twice daily.  14. Levaquin 250 mg IV every 24 hours.   SOCIAL HISTORY: As above. She is living with her granddaughter and grandson. They have a good relationship. They are  supportive and take good care of her, but she still would prefer to relocate to [place]. She quit smoking 12 years ago.   REVIEW OF SYSTEMS: CONSTITUTIONAL: No fevers or chills. Positive for fatigue. No weight changes. EYES: No double or blurred vision. ENT: No hearing loss. RESPIRATORY: Positive for shortness of breath, on oxygen. CARDIOVASCULAR: No chest pain or orthopnea. GASTROINTESTINAL: No abdominal pain, nausea, vomiting, or diarrhea. GU: No incontinence or frequency. ENDOCRINE: No heat or cold intolerance. LYMPHATIC: No anemia or easy bruising. INTEGUMENTARY: No acne or rash. MUSCULOSKELETAL: No muscle or joint pain. NEUROLOGIC: No tingling or weakness. PSYCHIATRIC: See history of present illness for details.   PHYSICAL EXAMINATION:  VITAL SIGNS: Blood pressure 119/35, pulse 114, respirations 24.   GENERAL: This is a well-developed, pleasant, elderly lady in no acute distress breathing with some effort. The rest of the physical examination is deferred to her primary attending.   LABORATORY, RADIOLOGICAL AND DIAGNOSTIC DATA: Chemistries are within normal limits. LFTs are within normal limits. Cardiac enzymes negative. TSH 1.21. Urine tox screen positive for benzodiazepines. CBC within normal limits. Urinalysis: leukocyte esterase 3+/200 white cells per field. EKG: Undetermined rhythm, low voltage, poor quality of study.   MENTAL STATUS EXAMINATION: The patient is alert. She is examined in the Critical Care Unit. She is oriented to person, place, time, and situation. She is pleasant, polite, and cooperative. She is short of breath and talking, causes some distress but she is able to participate in the interview. She is meticulously groomed and wearing hospital gown. She maintains good eye contact. Her speech is of normal rhythm, rate, and volume. Mood is fine with full affect. Thought processing is logical and goal oriented. Thought content: She denies suicidal or homicidal ideation. There are no  delusions or paranoia. There are no auditory or visual hallucinations. Her cognition is grossly intact. She registers three out of three and recalls three out of three objects after five minutes. She can spell world forward and backward. She knows the current president. Her fund of knowledge is above average. Her insight and judgment are good.   SUICIDE RISK ASSESSMENT: This is a patient with no past psychiatric history, but end-stage chronic obstructive pulmonary disease who in spite of her difficulties and physical illness, is forward thinking and optimistic about the future.   DIAGNOSES:  AXIS I: Anxiety secondary to medical condition.   AXIS II: Deferred.   AXIS III:  1. End-stage chronic obstructive pulmonary disease, on oxygen.  2. Hypertension. 3. Dyslipidemia. 4. Cardiomyopathy. 5. History of myocardial infarction. 6. Thrombocytopenia purpura.   AXIS IV: Chronic physical illness, family conflict.   AXIS V: GAF 50.   PLAN:  1. The patient denies any symptoms of depression and is not interested in pharmacotherapy. 2. Anxiety. She has been maintained on Xanax 0.25 mg twice daily. She wants to continue it. Her daughter who spoke with a psychiatrist believes that it could be beneficial to switch from Xanax to longer-lasting benzodiazepine like Ativan or clonazepam. We had a discussion about it. It will affect her breathing,  so a safe choice needs to be made. The patient seems to be okay taking Xanax as it has been prescribed all along.  3. I will sign off. Call me please if needed.  ____________________________ Ellin Goodie Jennet Maduro, MD jbp:ap D: 09/22/2011 16:09:42 ET T: 09/23/2011 08:54:22 ET JOB#: [id number] cc: Jariyah Hackley B. Jennet Maduro, MD, <Dictator> Shari Prows MD ELECTRONICALLY SIGNED 09/29/2011 0:41

## 2014-05-23 NOTE — Consult Note (Signed)
PATIENT NAME:  Donna [name], Donna [name] MR#:  [id number] DATE OF BIRTH:  Jul 02, 1933  DATE OF CONSULTATION:  09/22/2011  REFERRING PHYSICIAN:  Dr. Allena [name]  CONSULTING PHYSICIAN:  Donna E. Meredeth IdeFleming, MD  PRIMARY CARE PHYSICIAN: Dr. Leim FabryBarbara [name]   CHIEF COMPLAINT: Impending respiratory distress, hypotension.  HISTORY OF PRESENT ILLNESS: [name] is [name] 81 year old lady well known to Donna [name] with end-stage chronic obstructive pulmonary disease on chronic oxygen supplementation, known to have an FEV1 of 0.42, 90% of predicted, FVC 1.5 liters, stage IV chronic obstructive pulmonary disease who is in hospice service comes in gasping for breath, complains she cannot get her breath hence was brought to the hospital promptly after receiving [name] nebulizer treatment and Xanax at home. In the Emergency Room she was noted to be in respiratory distress. Systolic blood pressure was in the 50s but did not have any fever. No rashes. No lymph nodes or cough with no hemoptysis. No pleurisy, diarrhea, loose stools or dysuria. She also denies any flank pain, stiff neck. There was wheezing as well as increased use of accessory muscles. CT for PE protocol was done and did not show any pulmonary embolism. There was severe emphysematous changes noted. Patient was admitted to the Intensive Care Unit and placed on BiPAP, IV steroids as well as her home chronic obstructive pulmonary disease coverage which was started. Empiric Levaquin was also given. Hypotension was treated with fluid. She is also on Levophed at the time when I saw her. Tachycardia had improved. Presently her family is in the room. She is responsive, able to give history. BiPAP is now off and seemed to be holding her own. Does have [name] cough, somewhat tearful. Her blood gas on admission recorded showed pH 7.42, pCO2 52, pO2 190 on FiO2 of 0.3 on IPAP of 12, EPAP of 5 and [name] back-up rate of 12. Pulmonary is asked to see and to help with her management.   PAST MEDICAL HISTORY:  1. Stage  IV chronic obstructive pulmonary disease on chronic oxygen supplementation at 2 liters.  2. History of pulmonary hypertension.  3. Idiopathic thrombocytopenia purpura followed by Dr. Sherrlyn [name].  4. Hypertension.  5. Hyperlipidemia.  6. History of cardiomegaly with normal left ventricular function.  7. History of non-ST MI back in June 2011. Status post catheterization. No significant coronary artery disease.  8. 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 She was deemed nonsurgical candidate hence no further work-up was given.   9. Post shingles and neuropathic scalp pain.  10. Known left upper lobe and lower lobe pulmonary nodules which appear to be stable.   PAST SURGICAL HISTORY: 1. Status post cholecystectomy.  2. Status post retinal surgery.  3. Status post appendectomy.  4. Oral implants.   ALLERGIES: No known allergies.   PREADMISSION MEDICATIONS:  1. Advair 250/50 1 puff b.i.d.  2. Albuterol nebulizer treatment q.6 hours p.r.n.  3. Theophylline 200 mg 1 tablet p.o. b.i.d.  4. Ventolin 2 puffs q.4 hours p.r.n.  5. Spiriva one cap daily.  6. Simvastatin 10 mg at bedtime. 7. Prednisone tapering dose. 8. Lisinopril 10 mg daily.  9. Hydrochlorothiazide 25 mg daily.  10. Gabapentin 300 mg p.o. t.i.d.  11. Iron sulfate 325 mg p.o. daily.  12. Diltiazem 120 mg daily.  13. Xanax 0.5 mg 1 tablet b.i.d.  14. Alendronate 70 mg q. week.   SOCIAL HISTORY: Lives with her granddaughter. Quit smoking approximately 12 years ago. Does not drink any  alcohol. Does have [name] daughter in [place] who is planning to take her back to [place].   FAMILY HISTORY: Noted for cirrhosis.   REVIEW OF SYSTEMS: No headache, dizziness, passing out spells, choking spells. No nosebleeds, difficulty swallowing or gastroesophageal reflux disease. No neck pain, stiffness, or parotid enlargement. Respiratory as per above. There is wheezing, coughing and [name]  sensation of unable to get her breath but improved since being here. No chest pain, palpitations, syncope, orthopnea, pnd. No nausea, vomiting, diarrhea, blood in stool, dysuria, flank pain. No polyuria, polydipsia, heat or cold intolerance nor is she having any obvious bleeding. Does have thrombocytopenia, bruising easily, followed by Donna [name]. No hair loss. No lymph nodes. No numbness. No recent history to suggest, seizures, transient ischemic attacks, passing out spells. Does have anxiety and reactive depression but no suicidal ideation. No history of sleep apnea. Sleep is affected by her breathing. Rest of her review of system today was noncontributory.    PHYSICAL EXAMINATION:  VITAL SIGNS: Afebrile, pulse 84, respirations 15, oxygen saturation 98% on 2 liters nasal cannula.   GENERAL: Pleasant lady sitting in bed, able to speak in full sentences, glasses on.   HEENT: Normocephalic, nontraumatic. Extraocular movements intact. Sclerae clear. No nystagmus. Oropharynx no thrush. No exudates. Nares benign.   NECK: Supple. No jugular venous distention. No thyromegaly or stridor. No subcutaneous emphysema.   CHEST: Somewhat distant with rare wheeze but moving air. No consolidation.   CARDIAC: Regular rhythm and rate without any obvious murmurs or gallops.   ABDOMEN: Soft, nontender, no palpable abnormality.   EXTREMITIES: No edema, cyanosis.   SKIN: Fair turgor. No healing ulcers.    LYMPH: No nodes.   NEUROLOGIC: Able to raise all extremities against gravity. Sensation and cranial nerves appear to be grossly intact.   PSYCHIATRIC: Appropriate affect, less anxious than she typically usually is. No hallucinations.   LABORATORY, DIAGNOSTIC AND RADIOLOGICAL DATA: Glucose 95, BUN 39, creatinine 1.09, sodium 142, potassium 4.3, chloride 100, CO2 35. Liver enzymes are normal. Troponin less than 2. BNP 431, theophylline 11.9, WBC 9.8, hemoglobin 12.4, hematocrit 38.1, platelets 219.    IMPRESSION: This is [name] pleasant 81 year old lady well known to [place] who has end-stage chronic obstructive pulmonary disease with FEV1 less than 0.5 liters,  here with impending respiratory distress most likely secondary to chronic obstructive pulmonary disease exacerbation. I do not see any reason to suspect PE, no pneumonia and does not appear to be in pulmonary edema. She does have pulmonary hypertension, most likely secondary to her chronic obstructive pulmonary disease and chronic hypoxia. There is also [name] component of reactive depression and anxiety which also fuels her shortness of breath. She is also quite deconditioned. Her life expectancy is poor hence I agree with palliative and continue hospice with her. In the meantime will continue the same drug regimen that she is on at present. Will follow up on Doppler of the legs. Deep vein thrombosis prophylaxis recommended. Will reassess in the [name].m.    Again, thank you for the referral.   ____________________________ Donna E. Meredeth Ide, MD hef:cms D: 09/25/2011 17:48:48 ET T: 09/26/2011 08:08:58 ET JOB#: [id number]  cc: Donna E. Meredeth Ide, MD, <Dictator> Mertie Moores MD ELECTRONICALLY SIGNED 10/07/2011 18:47

## 2014-05-23 NOTE — H&P (Signed)
PATIENT NAME:  Donna [name], Donna [name] MR#:  [id number] DATE OF BIRTH:  1933-11-23  DATE OF ADMISSION:  09/22/2011  PRIMARY CARE PHYSICIAN: Leim FabryBarbara Aldridge, MD  ED REFERRING PHYSICIAN: Jene Everyobert Kinner, MD  CHIEF COMPLAINT: Shortness of breath, hypotension.   HISTORY OF PRESENT ILLNESS: The patient is [name] 81 year old white female with end-stage chronic obstructive pulmonary disease, on chronic oxygen, of 2 liters at home. The patient apparently was started with palliative/hospice services recently because of her chronic obstructive pulmonary disease. She was noted to be gasping for air and very short of breath earlier this morning. Her granddaughter who she lives with gave her [name] nebulizer treatment, also gave her Xanax, and called the hospice nurse. The hospice nurse recommended the patient be transferred to the ER. When the patient was brought into the ED, she was in respiratory distress. She was also noticed to have hypotension with blood pressures in the 50s. She has not had any fevers. She has had chronic cough, which is unchanged. The patient has not had any diarrhea or vomiting. Her granddaughter does report that last week her blood pressure was [name] little low, but then it went back up. The patient has not had any chest pain or palpitations. She has been having increasing wheezing. Otherwise she denies any abdominal pain, nausea, vomiting, or urinary symptoms.   PAST MEDICAL HISTORY:  1. End-stage chronic obstructive pulmonary disease, on 2 liters of oxygen.  2. History of pulmonary hypertension secondary to chronic obstructive pulmonary disease.  3. Idiopathic thrombocytopenia purpura, followed by Dr. Janese BanksSandeep Pandit.  4. Hypertension.  5. Hyperlipidemia.  6. History of cardiomyopathy with normal LVF. 7. History of positive non-ST-myocardial infarction, however cardiac catheterization apparently was negative in June 2011. 8. History of colovesicular fistula with cystic mass in the right adnexa abutting  distal urethra. Differential includes possible benign ovarian cyst or malignant cystic neoplasm. Due to patient's severe chronic obstructive pulmonary disease, she was not deemed [name] surgical candidate. She opted for no surgical intervention.   PAST SURGICAL HISTORY:  1. Status post cholecystectomy.  2. Surgery on retina.  3. Status post appendectomy. 4. Oral implants.   ALLERGIES: No known drug allergies.   HOME MEDICATIONS:  1. Advair 250/50 one puff twice [name] day. 2. Albuterol nebulizer every six hours as needed. 3. Alendronate 70 mg weekly.  4. Alprazolam 0.25 mg 1 tab p.o. twice [name] day. 5. Diltiazem 120 mg 1 tab p.o. daily.  6. Iron sulfate 325 mg 1 tab p.o. daily.  7. Gabapentin 300 mg one tab p.o. three times daily. 8. Hydrochlorothiazide 25 mg daily.  9. Lisinopril 10 mg daily.  10. Prednisone 20 mg 1 tab p.o. daily for today then 1/2 tablet of 20 mg for three more days.  11. Simvastatin 10 mg at bedtime.  12. Spiriva 18 mcg daily.  13. Theophylline 200 mg 1 tab p.o. twice [name] day.  14. Ventolin 2 puffs every four hours p.r.n.   SOCIAL HISTORY: History of smoking, quit 12 years ago. She lives with her granddaughter.   FAMILY HISTORY: Father with cirrhosis. Mother died of natural causes.  REVIEW OF SYSTEMS: CONSTITUTIONAL: he patient complains of general fatigue and weakness. No fevers. No weight gain or weight loss. HEENT: Denies any blurred vision or double vision. No redness. No drainage. ENT: Denies any epistaxis. No tinnitus. No difficulty with hearing. No difficulty swallowing. RESPIRATORY: Chronic shortness of breath with wheezing, chronic cough. No hemoptysis. CARDIOVASCULAR: No chest pain. No orthopnea. No palpitations. GI: No nausea. No  vomiting. No diarrhea. No abdominal pain. GENITOURINARY: Denies any frequency, urgency, or hesitancy. ENDOCRINE: No polyuria or nocturia. No burning with urination. HEMATOLOGIC: Has history of easy bruisability. Is being followed by Dr. Janese Banks for thrombocytopenia which was felt to be due to idiopathic thrombocytopenia purpura. SKIN: Has bruising with fragile skin. NEUROLOGIC: No numbness. No cerebrovascular accident. No transient ischemic attack. No seizures. PSYCHIATRIC: The patient does have history of anxiety and depression. VASCULAR: Denies any claudication symptoms.   PHYSICAL EXAMINATION:   VITALS: Temperature 98.7 on presentation, pulse 110, blood pressure 56/73, and O2 97% on BiPAP.   GENERAL: The patient is [name] critically ill-appearing female with accessory muscle usage.   HEENT: Head atraumatic, normocephalic. Pupils are equally round and reactive to light and accommodation. There is no conjunctival pallor. No scleral icterus. Nasal exam shows no drainage or ulceration. clear. Lips are very dry. There is no exudate of the oropharynx.   NECK: No thyromegaly. No carotid bruits.   CARDIOVASCULAR: Regular rate and rhythm, tachycardic. No murmurs, rubs, clicks, or gallops.   LUNGS: Diminished breath sounds bilaterally without any active rales, rhonchi, or wheezing with accessory muscle usage.   ABDOMEN: Soft, nontender, and nondistended. Positive bowel sounds x4.   EXTREMITIES: No clubbing, cyanosis, or edema.   SKIN: No rash.   LYMPHATICS: No lymph nodes palpable.   VASCULAR: Good DP and PT pulses.   PSYCHIATRIC: Not anxious or depressed. Awake, alert, and oriented x3.   NEUROLOGICAL: Cranial nerves II through XII grossly intact. No focal deficits.   SKIN: Currently no active rash.  EVALUATIONS IN THE EMERGENCY DEPARTMENT: Glucose 97, BNP 431, BUN 39, creatinine 1.09, sodium 142, potassium 4.3, chloride 100, CO2 35, and calcium 9.0. LFTs: Total protein 6.3, albumin 3.3, bilirubin total 0.5, alkaline phosphatase 49, AST 16, and ALT 15. Troponin less than 0.02. WBC 9.8, hemoglobin 12.4, and platelet count 219.   ABG: pH 7.42, pCO2 52, and pO2 119.   CT per PE protocol shows no CT evidence of PE, severe  bilateral emphysematous changes.   ASSESSMENT AND PLAN: The patient is [name] 81 year old with end-stage chronic obstructive pulmonary disease who was brought by family for worsening shortness of breath. The patient also was hypotensive on presentation.  1. Acute on chronic respiratory failure due to chronic obstructive pulmonary disease exacerbation. At this time, we will continue BiPAP. Place the patient on IV steroids with IV Solu-Medrol. We will place her on Xopenex nebulizers due to her heart rate being high. We will treat her with empiric Levaquin in light of her acute chronic obstructive pulmonary disease exacerbation.   2. Acute on chronic obstructive pulmonary disease exacerbation. Treatment as outlined above with nebulizers, Solu-Medrol, and IV Levaquin.  3. Hypotension, possibly due to volume depletion with concurrent treatment with antihypertensives. We will hold all her antihypertensives. The patient does not have [name] PE to explain the hypotension. Her WBC is normal. There is no evidence of sepsis. We will get [name] set of blood cultures. We will also keep her on Levophed which was started in the ED. Monitor her blood pressure.  4. Tachycardia, likely due to chronic obstructive pulmonary disease flare. We will hold Cardizem in light of her hypotension, follow her heart rate.  5. Hyperlipidemia. Continue simvastatin.  6. Recent evaluation for depression. The patient's granddaughter is concerned about the patient's ability to make decisions due to her grandmother wanting to go live in [place]. She requests psychiatry evaluation for competency.  7. CODE STATUS. The patient reports that she "still wants to live". We will keep her FULL CODE, currently followed by palliative/hospice home. We will ask palliative care team to see with assistance with code status.  CRITICAL CARE TIME SPENT: 40 minutes.  ____________________________ Lacie Scotts Allena Katz, MD shp:slb D: 09/22/2011 08:42:56  ET T: 09/22/2011 09:18:59 ET JOB#: [id number]  cc: Cataleya Cristina H. Allena Katz, MD, <Dictator> Katina Dung. Dayna Barker, MD Charise Carwin MD ELECTRONICALLY SIGNED 09/25/2011 8:36

## 2014-05-23 NOTE — Discharge Summary (Signed)
PATIENT NAME:  Donna Donna [name], Donna Donna [name] MR#:  [id number] DATE OF BIRTH:  07/04/1933  DATE OF ADMISSION:  09/22/2011 DATE OF DISCHARGE:  09/25/2011  ADMITTING DIAGNOSIS: Shortness of breath, cough.   DISCHARGE DIAGNOSES:  1. Acute on chronic respiratory failure due to chronic obstructive pulmonary disease exacerbation.  2. Hypotension felt to be due to Donna [name] combination of dehydration and possible medications, resolved with IV hydration. 3. End-stage chronic obstructive pulmonary disease.  4. Depression/anxiety disorder, status post psychiatric evaluation.  5. Tachycardia felt to be due to chronic obstructive pulmonary disease flare, heart rate improved.  6. Hyperlipidemia. 7. Idiopathic thrombocytopenia purpura, followed by Dr. Sherrlyn HockPandit.  8. History of pulmonary hypertension secondary to chronic obstructive pulmonary disease.  9. Cardiomyopathy with normal ejection fraction.  10. History of positive non-ST myocardial infarction based on elevated cardiac enzymes, however cardiac catheterization was negative in June 2011.  11. History of colovesicular fistula with cystic mass in the right adnexa abutting distal urethra. Differential included benign ovarian cyst or malignant cystic neoplasm due to the patient's severe chronic obstructive pulmonary disease. She was not deemed Donna [name] surgical candidate. The patient opted for no surgical intervention.  12. Status post cholecystectomy.  13. History of surgery on the retina.  14. Status post appendectomy.  15. Status post oral implants.   PERTINENT LABORATORY, DIAGNOSTIC AND RADIOLOGICAL DATA: Glucose was 97. BNP was 431, BUN 39, creatinine 1.09, sodium 142, potassium 4.3, chloride 100, CO2 35, calcium 9.0. LFTs: Total protein 6.3, albumin 3.3, total bilirubin 0.5, alkaline phosphatase 49, AST 16, ALT 15. Troponin was less than 0.02. WBC was 9.8, hemoglobin 12.4. ABG: pH of 7.42, pCO2 of 52, pO2 of 119. CT per PE protocol showed no CT evidence of PE, severe bilateral  emphysematous changes.  CONSULTANTS:  1. Dr. Ned ClinesHerbon Fleming.  2. Dr. Harriett SineNancy Phifer of Palliative Care.  3. Dr. Kristine LineaJolanta Pucilowska.    HOSPITAL COURSE: Please refer to History and Physical done by me on presentation. The patient is Donna [name] 81 year old white female with severe chronic obstructive pulmonary disease who is on chronic oxygen at home, who was followed by Palliative Care team/Hospice Service, who was brought in because of acute onset of shortness of breath. The patient initially had to be placed on BiPAP. Also on presentation, she was noted to be hypotensive with Donna [name] systolic blood pressure in the 50s. The patient was given aggressive IV fluid boluses. Despite that, she had to be kept on Donna [name] Levophed drip with IV fluids and holding her HCTZ and her Cardizem, her blood pressure normalized. The patient also in terms of her acute on chronic respiratory failure initially required BiPAP and then subsequently was weaned off to 2 liters of oxygen. She was treated with nebulizers, IV antibiotics. With these treatments, her respiratory status improved. She was seen in consultation by her pulmonologist, who concurred with the plan. The patient is doing much better and is stable for discharge. [place] is planning to move to [place].   DISCHARGE MEDICATIONS:  1. Advair 250/50, 1 inhalation b.i.d.  2. Alendronate 70 mg once Donna [name] week.  3. Simvastatin 10 mg daily.  4. Spiriva 18 mcg daily.  5. Theophylline 200 mg, 1 tab p.o. b.i.d.  6. Gabapentin 300 mg, 1 tab p.o. t.i.d.  7. Iron sulfate 1 tab p.o. daily.  8. Ventolin 2 puffs q.i.d. as needed.  9. Albuterol, Atrovent nebulizers every 6 hours p.r.n.  10. Diltiazem 120 mg daily.  11. Alprazolam 0.25 mg, 1 to 2 tabs every 6 hours  p.r.n. agitation.  12. Prednisone taper. 13. Levaquin 500 mg, 1 tab p.o. every 24 hours x3 days.   HOME OXYGEN: Yes, 2 liters.   DIET: Low sodium diet, consistency regular.   ACTIVITY: As tolerated.   REFERRALS: Resume Home Hospice.    TIMEFRAME FOR FOLLOWUP: Follow up in two to four weeks with new physician in [place]. Daughter is to arrange this.    TIME SPENT: 32 minutes spent.   ____________________________ Lacie Scotts. Allena Katz, MD shp:cbb D: 09/25/2011 15:03:29 ET T: 09/26/2011 10:01:45 ET JOB#: [id number]  cc: Brylin Stanislawski H. Allena Katz, MD, <Dictator> Charise Carwin MD ELECTRONICALLY SIGNED 09/26/2011 16:03

## 2014-05-23 NOTE — Consult Note (Signed)
Brief Consult Note: Diagnosis: Anxiety secondary to medical condition.   Patient was seen by consultant.   Consult note dictated.   Recommend further assessment or treatment.   Orders entered.   Discussed with Attending MD.   Comments: [name] does not have psychiatric history. She is getting Xanax 0.25 mg twice daily to address anxiety stemming from COPD. I was asked to evaluate this patient for depeession and the capacity to make a decision about her living arrangments. The patient denies symptoms of depression. There are no sefety issues. The patient preefers to relocate to [place] with her daughter.   PLAN: 1. The patient does have the capacity to make decisions about her disposition.   2. She is to continue Xanax as prescribed by her PCP. The family inquires if a longer lasting benzodiazepine like clonazepam or ativan could be used to better keep anxiety under control.   3. I will sign off.  Electronic Signatures: Kristine LineaPucilowska, Gery Sabedra (MD)  (Signed 19-Aug-13 15:03)  Authored: Brief Consult Note   Last Updated: 19-Aug-13 15:03 by Kristine LineaPucilowska, Armenta Erskin (MD)

## 2014-05-28 NOTE — Consult Note (Signed)
PATIENT NAME:  Donna [name], Donna [name] MR#:  [id number] DATE OF BIRTH:  03-20-1933  DATE OF CONSULTATION:  08/05/2011  REFERRING PHYSICIAN:  Dr. Vianne Bulls  CONSULTING PHYSICIAN:  Keiona Jenison R. Ma Hillock, MD  REASON FOR CONSULTATION: Severe thrombocytopenia with platelet count 10,000.   HISTORY OF PRESENT ILLNESS: The patient is a 81 year old female who has been admitted to the hospital on July 1st with complaints of progressive difficulty in breathing, felt to have COPD exacerbation. Upon admission. CBC showed platelet count was extremely low at 10,000, hemoglobin 12.7, and WBC 6600. Creatinine was 0.59. The patient denies any fever or chills. She denies any confusion or other altered mental status. Currently states that she is beginning to feel better in regards to dyspnea. She denies any known prior history of thrombocytopenia. She denies alcohol usage. She denies any known history of chronic liver disease, splenomegaly, or lymphadenopathy.   PAST MEDICAL HISTORY/PAST SURGICAL HISTORY:  1. Chronic obstructive pulmonary disease, end-stage, on 3.5 liters oxygen.  2. Pulmonary hypertension secondary to chronic obstructive pulmonary disease.  3. Hyperlipidemia.  4. Hypertension.  5. Cardiomyopathy by history.  6. Possible fistula, cystic mass in right adnexa abutting distal urethra. The patient is felt not to be surgical candidate and denies having any further work-up.   FAMILY HISTORY: Noncontributory. Denies hematological disorders. Father had cirrhosis.   SOCIAL HISTORY: Heavy smoking history, quit about 12 years ago. Denies alcohol usage. Lives with her granddaughter.   HOME MEDICATIONS:  1. Albuterol nebulizers every six hours. 2. Alendronate 70 mg q. week. 3. Advair Diskus 250/50 one puff b.i.d.  4. Aspirin 325 mg daily. 5. Cardizem 120 mg daily.  6. Gabapentin 300 mg t.i.d.  7. Xanax 0.25 mg b.i.d.  8. Lisinopril 10 mg daily.  9. Simvastatin 10 mg daily.  10. Spiriva 18 mcg inhalation daily.   11. Theophylline 200 mg extended-release b.i.d.  12. Ventolin 2 puffs q.i.d.  13. Ibuprofen 400 mg b.i.d. almost regularly for about two years for postherpetic neuralgia.   ALLERGIES: No known drug allergies.   REVIEW OF SYSTEMS: CONSTITUTIONAL: Has chronic fatigue and dyspnea on minimal activity, has end-stage COPD on oxygen. Denies fevers or chills. No night sweats. HEENT: No dizziness. Had minor episode of epistaxis last week but none currently. No new sinus symptoms. CARDIAC: No angina, palpitation, orthopnea, or PND. LUNGS: As in history of present illness. No hemoptysis. GI: No nausea, vomiting, or diarrhea. Denies bright red blood in stools or melena. GU: No dysuria or hematuria. SKIN: No new rashes or pruritus. HEMATOLOGIC: Has noticed easy skin bruising for the last few days, no other major bleeding symptoms. NEUROLOGIC: No new focal weakness, loss of consciousness, or seizures. MUSCULOSKELETAL: No new bone pains. ENDOCRINE: No polyuria or polydipsia. Appetite is steady.   PHYSICAL EXAMINATION:   GENERAL: The patient is a weak looking, thin individual, on nasal cannula oxygen. Otherwise, alert and oriented and converses appropriately. No acute distress. No icterus.   VITAL SIGNS: Temperature 97.8, pulse 85, respiratory rate 22, blood pressure 109/67, 96% on 2.5 liters oxygen.   HEENT: Normocephalic, atraumatic. Extraocular movements intact. Sclerae anicteric. No oral thrush or petechiae.   NECK: Negative for lymphadenopathy.   CARDIOVASCULAR: S1, S2, regular rate and rhythm.   LUNGS: Bilateral markedly diminished air movement. Occasional rhonchi. No crepitation.   ABDOMEN: Soft, nontender. No hepatosplenomegaly clinically.   EXTREMITIES: No major edema or cyanosis.   SKIN: No generalized rashes. She does have a bruise over the right elbow area, otherwise only a few tiny  bruises on forearms. No ecchymosis or petechiae.   NEUROLOGIC: Nonfocal, cranial nerves intact.    LYMPHATICS: No adenopathy in the axillary or inguinal areas.   MUSCULOSKELETAL: No obvious joint deformity or swelling.   LABORATORY, DIAGNOSTIC, AND RADIOLOGICAL DATA: CBC today shows WBC 5200, hemoglobin 11.4, MCV 90, platelet count 16, ANC 4200, creatinine 0.7, calcium 9.0.   CT abdomen and pelvis August 2012 showed cystic mass in the right adnexa, possible colovesical fistula along the left side of the bladder.   IMPRESSION AND RECOMMENDATIONS: This is a 81 year old female patient with multiple medical problems including end-stage COPD on nasal cannula oxygen admitted with progressive dyspnea likely from COPD exacerbation and also found to have newly diagnosed severe thrombocytopenia with platelet count down to 10,000. Clinically she only has skin bruising, otherwise no other obvious bleeding symptoms. Likely possibilities for thrombocytopenia includes ITP (immune thrombocytopenic purpura) versus other etiology and she will need further work-up. The patient has been started on Solu-Medrol. Her platelet count today is showing some response and is up to 16,000. Recommend continuing on Solu-Medrol q.12 hours IV and monitor daily CBC. Will also get work-up including manual differential, reticulocyte count, LDH and haptoglobin, direct platelet antibody test to look for evidence of ITP, serum protein immunofixation electrophoresis, B12 and folate level, iron study, PT/PTT/fibrinogen/FDP, HBsAg, HCV antibody and ultrasound of the abdomen to evaluate for hepatosplenomegaly. Will follow-up after work-up report is available and decide if she will need bone marrow biopsy evaluation also. Review of peripheral smear today does not show any evidence of schistocytes. She remains afebrile and does not have renal insufficiency. Would consider platelet transfusion if she develops major bleeding symptoms.   The patient explained above, agreeable to this plan.     Thank you for the referral. Please feel please  feel free to contact me if any additional questions.   ____________________________ Rhett Bannister Ma Hillock, MD srp:drc D: 08/06/2011 00:31:29 ET T: 08/06/2011 06:16:33 ET JOB#: [id number]  cc: Trevor Iha R. Ma Hillock, MD, <Dictator> Alveta Heimlich MD ELECTRONICALLY SIGNED 08/06/2011 9:15

## 2014-05-28 NOTE — Discharge Summary (Signed)
PATIENT NAME:  Donna [name], Donna [name] MR#:  [id number] DATE OF BIRTH:  25-Apr-1933  DATE OF ADMISSION:  08/04/2011 DATE OF DISCHARGE:  08/06/2011  ADMITTING DIAGNOSIS: Acute on chronic respiratory failure likely due to kinked oxygen tube.   DISCHARGE DIAGNOSES:  1. Idiopathic thrombocytopenic purpura. 2. Acute on chronic respiratory failure due to kinking of tubing system. 3. Post-herpetic neuralgia, stable. 4. Ovarian cyst.  5. Hypertension.  6. Hyperlipidemia.  7. History of chronic obstructive pulmonary disease, end-stage, on 3.5 liters of oxygen at home.  8. History of cardiomyopathy with no evidence of coronary artery disease. Normal cardiac catheterization in June 2011.  9. History of tobacco abuse, quit 12 years ago.   DISCHARGE CONDITION: Stable.   DISCHARGE MEDICATIONS: The patient is to resume her outpatient medications. 1. Advair Diskus 250/50 one puff twice daily.  2. Albuterol inhaler 2.5 mg in 3 mL inhalation solution, 3 mL inhalation every six hours as needed.  3. Alendronate 70 mg p.o. once weekly.  4. Diltiazem 120 mg p.o. daily.  5. Gabapentin 300 mg p.o. three times daily. 6. Lisinopril 10 mg p.o. at bedtime.  7. Simvastatin 10 mg p.o. at bedtime.  8. Spiriva 1 capsule inhalation daily.  9. Theophylline 200 mg p.o. twice daily.  10. Alprazolam 0.25 mg p.o. twice daily.  11. Ventolin HFA 2 puffs four times daily.   ADDITIONAL MEDICATION: Prednisone 60 mg p.o. daily.   NOTE: The patient was advised not to take aspirin or Advil or like medications.   DIET: 2 grams salt, low fat, low cholesterol.   ACTIVITY LIMITATIONS: As tolerated.   REFERRAL: Home health PT as well as RN 2 to 7 times a week.   DISCHARGE FOLLOWUP: Followup with Dr. Leia Alf in the [place] this Friday, on 08/08/2011.   CONSULTANTS: Leia Alf, MD  RADIOLOGIC STUDIES: Chest x-ray, PA and lateral, on 08/04/2011: Hyperinflation consistent with chronic obstructive pulmonary disease. No  evidence of congestive heart failure or pneumonia.   HISTORY OF PRESENT ILLNESS: The patient is a 81 year old Caucasian female with past medical history significant for history of endstage chronic obstructive pulmonary disease with oxygen dependence who presented to the hospital with complaints of shortness of breath. Please refer to Dr. Lise Auer Konidena's admission note on 08/04/2011. Apparently the patient was having difficulty breathing in the morning. When EMS arrived, she was found to have a kinked oxygen tube and her oxygen saturations were 89% on oxygen, however, when she was placed on oxygen her oxygen saturation improved to 99%. She had no chest pain and no fevers or cough. Chest x-ray was within normal limits. Her temperature was 98.3, pulse 91, respirations 20, blood pressure 149/63, and saturation was 99% on oxygen therapy. Physical examination was unremarkable. Her EKG showed normal sinus rhythm at 72 beats, no acute ST-T changes were noted. On the patient's lab data, BNP was within normal limits, except bicarbonate was elevated to 36. The patient's troponin was less than 0.02. On CBC white blood cell count was 6.6, hemoglobin 12.7, and platelet count was low at 10. D-dimer was slightly elevated at 0.65.   HOSPITAL COURSE: The patient was admitted to the hospital with diagnosis of acute on chronic renal failure as well as severe thrombocytopenia. Oncology consultation was obtained and the patient's aspirin as well as ibuprofen were placed on hold. Dr. Ma Hillock saw the patient in consultation the next day, on 08/05/2011. He looked at manual differential and saw no schistocytes. 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 Clearly however she had only skin bruising, otherwise no other bleeding symptoms. Dr. Ma Hillock felt that the patient's likely possibility included  IDP which is immune thrombocytopenic  purpura versus other etiology and felt that the patient needs further work-up. He recommended to continue Solu-Medrol while she was in the hospital and later on prednisone. He recommended to check the patient's manual differential, as mentioned above, also reticular count, LDH as well as haptoglobin and direct platelet antibody test to look for evidence of IDP, serum protein immunofixation electrophoresis and Y59 as well as folic acid level, iron studies, PT, PTT, fibrinogen, and FDP as well as hepatitis B surface antigen and hepatitis C viral antibody as well as ultrasound of the abdomen to evaluate for hepatosplenomegaly. Dr. Ma Hillock recommended to follow up this work-up report and make decision if the patient needs a bone marrow biopsy for evaluation.  He reviewed peripheral blood smear which did not show any evidence of schistocytes. The patient remained afebrile and had no renal insufficiency. With steroids the patient's platelet count improved from 10,000 on the day of arrival to 33,000 on day of discharge, 08/06/2011. The patient's lab studies are not yet back, however, the patient did have vitamin B12 level checked which was normal at 573 pic/mL. CA-125 was 18.1, which is within normal limits. Haptoglobin level was normal at 140. Hepatitis B surface antigen was negative. Hepatitis C virus antibody was negative. Protein immunoelectrophoresis is pending. Fibrinogen was normal at 458. FDP products were between 10 to 40, which is slightly elevated. Pro time was normal at 12.8. INR was normal at 0.9. Activated PTT was normal at 32.6. Reticulocyte count was elevated at 1.92. Absolute reticulocyte count was normal at 0.0797.   The patient is being discharged to home today. She is to continue prednisone daily. She is to follow up with Dr. Leia Alf in the next 2 days after discharge for further recommendations. Her vital signs are stable. Temperature is 98.1, pulse 76,  respiratory rate 22, blood pressure 115/59, and saturation 99% on 2.5 liters of oxygen through nasal cannula at rest.   TIME SPENT: 40 minutes. ____________________________ Theodoro Grist, MD rv:[id number] D: 08/06/2011 15:51:51 ET     T: 08/08/2011 11:17:57 ET        JOB#: [id number] cc: Theodoro Grist, MD, <Dictator> Sandeep R. Ma Hillock, MD Theodoro Grist MD ELECTRONICALLY SIGNED 08/24/2011 18:03

## 2014-05-28 NOTE — Consult Note (Signed)
HEMATOLOGY followup note - sitting in bed, states SOB is better. Denies bleeding Sxs.no fever. Eating better.alert and oriented, NAD      vitals - afebrile, stable      lungs - b/l good air entry      ext - both leg in dressing.- WBC 3600, ANC 3300, Hb 12.1, platelets 33K.  US abdomen negative for hepatosplenomegaly.  Impression/Recommendations: 81 year old female patient with multiple medical problems including end-stage COPD on nasal cannula oxygen admitted with COPD exacerbation and also found to have newly diagnosed severe thrombocytopenia with platelet count down to 10,000 - likely ITP, workup is unremarkable so far for other etiology. PLatelet count responding to steroid therapy, continue prednisone. She is being discharged today, will f/u as outpt on 7/5 with repeat CBC. Also advised to start oral iron ferrous sulfate 325 mg PO BID since serum iron and iron saturation are slightly low. She has h/o ovarian cyst, repeat CA-125 is normal also. In between she was advised to call or come to ER in case of major bleeding Sxs or acute sickness. The patient is agreeable to this plan.  Electronic Signatures: Izola PricePandit, Anely Spiewak Raj (MD)  (Signed on 04-Jul-13 00:36)  Authored  Last Updated: 04-Jul-13 00:36 by Izola PricePandit, Jaymes Revels Raj (MD)

## 2014-05-28 NOTE — H&P (Signed)
PATIENT NAME:  Donna [name], VASSELR#:  [id number] DATE OF BIRTH:  1933/07/04  DATE OF ADMISSION:  08/04/2011  PRIMARY CARE PHYSICIAN:  Dr. Leim Fabry  ER PHYSICIAN: Dr. Enedina Finner   CHIEF COMPLAINT: Trouble breathing.   HISTORY OF PRESENT ILLNESS: The patient is a 81 year old female with endstage chronic obstructive pulmonary disease on two and a half liters to three liters of oxygen all the time. She came in because she was having trouble breathing. The patient states that she had trouble breathing since this morning. When EMS arrived the patient was found to have a kinked oxygen tube and saturations were 89% on her oxygen. She was noticed to have saturations of 99% when they placed her on oxygen, and that was about 3.5 liters of oxygen. Saturations are 99%. The patient took one dose of nebulizer at home. The patient denies any chest pain. No cough, no fever. She said that her oxygen tube was noted to be kinked for almost one month but she was getting some oxygen and she did not feel more short of breath until this morning.   PAST MEDICAL HISTORY:  1. History of chronic obstructive pulmonary disease, which is endstage, on 3.5 liters of oxygen. 2. History of pulmonary hypertension secondary to chronic obstructive pulmonary disease. 3. Hypertension.  4. Hyperlipidemia.  5. History of cardiomyopathy with no evidence of coronary artery disease with normal LV function.  6. The patient was admitted in June 2011. At that time she actually had chronic obstructive pulmonary disease flare and also had a cardiac cath done because of trouble breathing and also positive non-ST-elevation myocardial infarction. Her cardo c catheterization was normal.  7.  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 May represent benign ovarian cyst or malignant cystic neoplasm, but the patient did not have any surgery or further work-up. According to the family it was not pursued because of her  severe chronic obstructive pulmonary disease. The patient has no ascites.     SOCIAL HISTORY: She used to smoke heavily, quit about 12 years ago. She lives with her granddaughter.   FAMILY HISTORY: Father had cirrhosis. Mother died of natural causes.   PAST SURGICAL HISTORY: None.   MEDICATIONS: 1. Advair Diskus 250/50, 1 puff b.i.d.  2. Albuterol nebulizer every 6 hours. 3. Alendronate 70 mg weekly. 4. Xanax 0.25 mg p.o. b.i.d.  5. Aspirin 325 mg p.o. daily.  6. Cardizem 120 mg p.o. daily.  7. Gabapentin 300 mg p.o. t.i.d.  8. Lisinopril 10 mg p.o. daily.  9. Simvastatin 10 mg p.o. daily.  10. Spiriva 18 mcg inhalation daily.  11. Theophylline 200 mg extended release, 1 tablet p.o. b.i.d.  12. Ventolin 2 puffs 4 times daily.  13. She also is on ibuprofen. She says she takes 200-mg tablets, 4 tablets, taking it for almost two years for her postherpetic neuralgia.  REVIEW OF SYSTEMS: CONSTITUTIONAL: Has no fever. Has some fatigue and easy bruising. EYES: No blurred vision. ENT: Had epistaxis last week. No tinnitus. No difficulty swallowing. RESPIRATORY: Short of breath at baseline but nothing worse. The patient did have some trouble breathing this morning but feels better. No cough. CARDIOVASCULAR: No chest pain. No orthopnea. No palpitations. GI: No nausea. No vomiting. No abdominal pain. GU: Has no dysuria. ENDOCRINE: No polyuria or nocturia. HEMATOLOGIC: Has easy bruising. INTEGUMENT: Has bruise observed over her elbows. NEUROLOGIC:  No numbness, no weakness.  PSYCH: The patient does have some anxiety history.   PHYSICAL EXAMINATION:  VITAL SIGNS:  Temperature 98.3, pulse 91, respirations 20, blood pressure 149/63, sats 99% on room air.   GENERAL: Alert, awake, and oriented. Elderly female, answering questions appropriately.   HEENT: Head atraumatic, normocephalic. Pupils equally reacting to light. Extraocular movements intact.   ENT: No tympanic membrane congestion. Nose- no  turbinate hypertrophy. Throat- no oropharyngeal erythema.   NECK: Normal range of motion. No JVD. No carotid bruit.  LUNGS:  Decreased breath sounds bilaterally but no wheeze, no rales.   CARDIOVASCULAR: S1 and S2 regular. The patient has no murmurs.   ABDOMEN: Soft, nontender. No hepatosplenomegaly.   MUSCULOSKELETAL: Strength 5/5 in upper and lower extremities.   SKIN: The patient does have some bruising over the elbows. No rashes.   NEUROLOGIC: Cranial nerves II through XII intact. Power 5/5 in upper and lower extremities. Sensation intact. Deep tendon reflexes 2+ bilaterally.   PSYCH: Mood and affect are within normal limits.   LABORATORY DATA:  Sodium 141, potassium 4.3, chloride 98, bicarbonate 36, BUN 11, creatinine 0.5 and glucose 86. Troponin less than 0.02. BNP 312. The patient's D-dimer was slightly high at 0.65. WBC 6.6, hemoglobin 12.7, hematocrit 40, platelets 10. CBC that was done in 07/2009: Hemoglobin was 10.7 and platelets were normal at that time.   EKG: Normal sinus, 72 beats.  No ST-T changes.  ASSESSMENT AND PLAN:   1. The patient is a 81 year old female with acute on chronic respiratory failure likely secondary to kinked oxygen tube.  Right now she is on three liters  oxygen and saturations are 99%. She has no wheezing so at this time we will continue home medications including albuterol and Spiriva along with theophylline and continue the oxygen.  2. Severe thrombocytopenia. The patient is taking aspirin and also she is on ibuprofen 200 mg q.i.d. for almost two years. Thrombocytopenia is likely related to NSAID use, but we will get oncology consult and also hold aspirin and ibuprofen.  possible ITP?may need steroids  3. Hypertension. The patient's blood pressure is stable. Continue Cardizem and also lisinopril.  4. History of postherpetic neuralgia on gabapentin 300 mg p.o. t.i.d. Continue that.  5. Anxiety. Continue Xanax.   CONDITION: Stable.   TIME SPENT:  About 60 minutes. I spoke with the patient and the patient's granddaughters at bedside in detail.    ____________________________ Katha HammingSnehalatha Nameer Summer, MD sk:bjt D: 08/04/2011 18:58:40 ET T: 08/05/2011 06:43:52 ET JOB#: [id number]  cc: Katha HammingSnehalatha Antinette Keough, MD, <Dictator> Katina DungBarbara D. Dayna BarkerAldridge, MD Katha HammingSNEHALATHA Caeden Foots MD ELECTRONICALLY SIGNED 08/12/2011 10:19
